# Patient Record
Sex: Male | Born: 1935 | Race: White | Hispanic: No | Marital: Married | Smoking: Never smoker
Health system: Southern US, Community
[De-identification: ages and names within clinical notes are randomized; demographics above are authoritative.]

## PROBLEM LIST (undated history)

## (undated) DIAGNOSIS — G309 Alzheimer's disease, unspecified: Secondary | ICD-10-CM

## (undated) DIAGNOSIS — I10 Essential (primary) hypertension: Secondary | ICD-10-CM

## (undated) DIAGNOSIS — F0280 Dementia in other diseases classified elsewhere without behavioral disturbance: Secondary | ICD-10-CM

---

## 2001-07-07 ENCOUNTER — Ambulatory Visit (HOSPITAL_COMMUNITY): Admission: RE | Admit: 2001-07-07 | Discharge: 2001-07-07 | Payer: Self-pay | Admitting: Cardiology

## 2011-11-14 ENCOUNTER — Emergency Department (HOSPITAL_COMMUNITY): Payer: Medicare Other

## 2011-11-14 ENCOUNTER — Other Ambulatory Visit: Payer: Self-pay

## 2011-11-14 ENCOUNTER — Encounter (HOSPITAL_COMMUNITY): Payer: Self-pay | Admitting: *Deleted

## 2011-11-14 ENCOUNTER — Emergency Department (HOSPITAL_COMMUNITY)
Admission: EM | Admit: 2011-11-14 | Discharge: 2011-11-14 | Disposition: A | Discharge status: Home / Self Care | Payer: Medicare Other | Attending: Emergency Medicine | Admitting: Emergency Medicine

## 2011-11-14 DIAGNOSIS — R9431 Abnormal electrocardiogram [ECG] [EKG]: Secondary | ICD-10-CM | POA: Insufficient documentation

## 2011-11-14 DIAGNOSIS — R011 Cardiac murmur, unspecified: Secondary | ICD-10-CM | POA: Insufficient documentation

## 2011-11-14 DIAGNOSIS — R42 Dizziness and giddiness: Secondary | ICD-10-CM | POA: Insufficient documentation

## 2011-11-14 HISTORY — DX: Essential (primary) hypertension: I10

## 2011-11-14 LAB — URINALYSIS, ROUTINE W REFLEX MICROSCOPIC
Ketones, ur: NEGATIVE mg/dL
Leukocytes, UA: NEGATIVE
Nitrite: NEGATIVE
Specific Gravity, Urine: 1.021 (ref 1.005–1.030)
Urobilinogen, UA: 0.2 mg/dL (ref 0.0–1.0)
pH: 5.5 (ref 5.0–8.0)

## 2011-11-14 LAB — CBC
HCT: 43.5 % (ref 39.0–52.0)
Hemoglobin: 15.3 g/dL (ref 13.0–17.0)
MCH: 31.4 pg (ref 26.0–34.0)
MCHC: 35.2 g/dL (ref 30.0–36.0)

## 2011-11-14 LAB — COMPREHENSIVE METABOLIC PANEL
Albumin: 3.5 g/dL (ref 3.5–5.2)
BUN: 22 mg/dL (ref 6–23)
Creatinine, Ser: 1.2 mg/dL (ref 0.50–1.35)
Total Bilirubin: 1.2 mg/dL (ref 0.3–1.2)
Total Protein: 6.4 g/dL (ref 6.0–8.3)

## 2011-11-14 LAB — DIFFERENTIAL
Basophils Relative: 0 % (ref 0–1)
Eosinophils Absolute: 0.1 10*3/uL (ref 0.0–0.7)
Monocytes Absolute: 0.4 10*3/uL (ref 0.1–1.0)
Monocytes Relative: 6 % (ref 3–12)

## 2011-11-14 LAB — TROPONIN I: Troponin I: <0.3 ng/mL (ref <0.30)

## 2011-11-14 LAB — URINE MICROSCOPIC-ADD ON

## 2011-11-14 MED ORDER — MECLIZINE HCL 12.5 MG PO TABS: 25.0000 mg | ORAL_TABLET | Freq: Three times a day (TID) | ORAL | Status: AC

## 2011-11-14 MED ORDER — DIAZEPAM 5 MG PO TABS: 2.5000 mg | ORAL_TABLET | Freq: Four times a day (QID) | ORAL | Status: AC | PRN

## 2011-11-14 MED ORDER — MECLIZINE HCL 25 MG PO TABS
25.0000 mg | ORAL_TABLET | Freq: Once | ORAL | Status: AC
Start: 2011-11-14 — End: 2011-11-14
  Administered 2011-11-14: 25 mg via ORAL
  Filled 2011-11-14: qty 1

## 2011-11-14 NOTE — ED Notes (Signed)
Pt. Is unable to use the restroom at this time. 

## 2011-11-14 NOTE — ED Provider Notes (Signed)
History     CSN: [id number]  Arrival date & time 11/14/11  0727   First MD Initiated Contact with Patient 11/14/11 [phone]      No chief complaint on file.    HPI The patient presents following the acute onset of dizziness.  He notes that he awoke approximately 3 hours ago in his usual state of health.  Just prior to arrival the patient was standing, developed a sense of unsteadiness, as though he was going to fall down.  The patient notes that he has had no confusion, chest pain, no dyspnea, no visual changes.  Symptoms persisted with standing, are minimally better when sitting, or resting.  The patient was noted to aspirin by his wife, denies notable changes. He notes that he has been compliant with his medication, and a beta blocker and an angiotensin receptor blocker. He denies recent total health events or any recent illness. No past medical history on file.  No past surgical history on file.  No family history on file.  History  Substance Use Topics  . Smoking status: Not on file  . Smokeless tobacco: Not on file  . Alcohol Use: Not on file      Review of Systems  Constitutional:       Per HPI, otherwise negative  HENT:       Per HPI, otherwise negative  Eyes: Negative.   Respiratory:       Per HPI, otherwise negative  Cardiovascular:       Per HPI, otherwise negative  Gastrointestinal: Negative for vomiting.  Genitourinary: Negative.   Musculoskeletal:       Per HPI, otherwise negative  Skin: Negative.   Neurological: Positive for dizziness and light-headedness. Negative for syncope, facial asymmetry, speech difficulty, weakness, numbness and headaches.    Allergies  Review of patient's allergies indicates not on file.  Home Medications  No current outpatient prescriptions on file.  There were no vitals taken for this visit.  Physical Exam  Nursing note and vitals reviewed. Constitutional: He is oriented to person, place, and time. He appears  well-developed. No distress.       Patient notes that he has been taking meds as directed, but one month supply (filled 2 months ago) has several pills remaining.  HENT:  Head: Normocephalic and atraumatic.  Right Ear: Tympanic membrane and ear canal normal. Tympanic membrane is not injected. No hemotympanum.  Left Ear: Ear canal normal.       L TM obscured by significant amounts of cerumen.  Eyes: Conjunctivae and EOM are normal.  Cardiovascular: Normal rate and regular rhythm.   Murmur heard. Pulmonary/Chest: Effort normal. No stridor. No respiratory distress.  Abdominal: He exhibits no distension.  Musculoskeletal: He exhibits no edema.  Neurological: He is alert and oriented to person, place, and time. He has normal strength. He displays no atrophy and no tremor. No cranial nerve deficit or sensory deficit. He exhibits normal muscle tone. He displays a negative Romberg sign. He displays no seizure activity. Coordination and gait normal.       No focal neuro deficits; CN, cerebellar, gait, romberg, pronator  Skin: Skin is warm and dry.  Psychiatric: He has a normal mood and affect.    ED Course  Procedures (including critical care time)   Labs Reviewed  CBC  DIFFERENTIAL  COMPREHENSIVE METABOLIC PANEL  TROPONIN I  URINALYSIS, ROUTINE W REFLEX MICROSCOPIC   No results found.   No diagnosis found.  Cardiac: 60 sr- normal  Pulse ox 100% ra- normal  CXR / Head CT both reviewed by me   Date: 11/14/2011  Rate: 54  Rhythm: normal sinus rhythm  QRS Axis: left  Intervals: PR prolonged  ST/T Wave abnormalities: nonspecific T wave changes  Conduction Disutrbances:left anterior fascicular block  Narrative Interpretation:   Old EKG Reviewed: none available  ABNORMAL ECG  MDM  This 78 year old male presents with vertigo.  On exam patient is in no distress, has no easily provoked symptoms, though he notes mild disequilibrium when upright.  The patient's physical exam notable  only for an occluded left tympanic membrane due to cerumen.  The patient's ECG, head CT and chest x-ray are reassuring.  I discussed the finding of a left ventricular lesion with the patient, his wife, his daughter.  Absent hydrocephalus, surrounding edema, and with calcifications, this lesion is likely to be causing today's new symptoms, but clearly warrants further evaluation (as an outpatient.)  Given the tympanic membrane occlusion, the patient's description of disequilibrium, Maisie Fus presentation is most consistent with vertigo.  I discussed this likely, as well as the necessity to evaluate other possible conditions, such as insufficient posterior circulation with the family.  Specifically discussed the need for carotid evaluation.  The patient was discharged in stable condition to follow up with a primary care physician.  The patient's primary care physician recently retired, he has been obtaining care at an urgent care center.  He was provided with one of our internal medicine physicians.        Gerhard Munch, MD 11/14/11 [phone]

## 2011-11-14 NOTE — Discharge Instructions (Signed)
As discussed, there are many possible causes of your lightheadedness.  It is very important that you follow up with a primary care physician to continue evaluation of these possibilities.  Please make sure to follow up as directed.  You need to be sure to discuss both an MRI for additional characterization of a possible brain lesion, and ultrasound study of your carotid arteries.  If you develop any new, or concerning changes in your condition, please return to emergency department immediately.

## 2011-11-14 NOTE — ED Notes (Signed)
Pt reports feeling lightheaded upon waking this am. Noticed it was worse when standing. Hx of HTN, took meds today. Denies chest pain, shob. No facial droop, arm drift, unilateral weakness noted.

## 2016-12-07 ENCOUNTER — Emergency Department (HOSPITAL_COMMUNITY): Payer: Medicare Other

## 2016-12-07 ENCOUNTER — Emergency Department (HOSPITAL_COMMUNITY)
Admission: EM | Admit: 2016-12-07 | Discharge: 2016-12-12 | Disposition: A | Discharge status: Home / Self Care | Payer: Medicare Other | Attending: Emergency Medicine | Admitting: Emergency Medicine

## 2016-12-07 DIAGNOSIS — F0280 Dementia in other diseases classified elsewhere without behavioral disturbance: Secondary | ICD-10-CM | POA: Diagnosis present

## 2016-12-07 DIAGNOSIS — F0281 Dementia in other diseases classified elsewhere with behavioral disturbance: Secondary | ICD-10-CM | POA: Insufficient documentation

## 2016-12-07 DIAGNOSIS — G309 Alzheimer's disease, unspecified: Secondary | ICD-10-CM

## 2016-12-07 DIAGNOSIS — F02818 Dementia in other diseases classified elsewhere, unspecified severity, with other behavioral disturbance: Secondary | ICD-10-CM

## 2016-12-07 DIAGNOSIS — G300 Alzheimer's disease with early onset: Secondary | ICD-10-CM | POA: Diagnosis not present

## 2016-12-07 DIAGNOSIS — Z79899 Other long term (current) drug therapy: Secondary | ICD-10-CM | POA: Diagnosis not present

## 2016-12-07 DIAGNOSIS — F0391 Unspecified dementia with behavioral disturbance: Secondary | ICD-10-CM | POA: Diagnosis not present

## 2016-12-07 DIAGNOSIS — G308 Other Alzheimer's disease: Secondary | ICD-10-CM

## 2016-12-07 DIAGNOSIS — Z9181 History of falling: Secondary | ICD-10-CM

## 2016-12-07 DIAGNOSIS — I10 Essential (primary) hypertension: Secondary | ICD-10-CM | POA: Insufficient documentation

## 2016-12-07 LAB — CBC WITH DIFFERENTIAL/PLATELET
BASOS ABS: 0 10*3/uL (ref 0.0–0.1)
Basophils Relative: 0 %
EOS ABS: 0.3 10*3/uL (ref 0.0–0.7)
EOS PCT: 3 %
HCT: 42.9 % (ref 39.0–52.0)
HEMOGLOBIN: 14.8 g/dL (ref 13.0–17.0)
LYMPHS ABS: 1.8 10*3/uL (ref 0.7–4.0)
Lymphocytes Relative: 18 %
MCH: 31.2 pg (ref 26.0–34.0)
MCHC: 34.5 g/dL (ref 30.0–36.0)
MCV: 90.3 fL (ref 78.0–100.0)
Monocytes Absolute: 0.7 10*3/uL (ref 0.1–1.0)
Monocytes Relative: 8 %
NEUTROS PCT: 71 %
Neutro Abs: 7 10*3/uL (ref 1.7–7.7)
PLATELETS: 181 10*3/uL (ref 150–400)
RBC: 4.75 MIL/uL (ref 4.22–5.81)
RDW: 13.1 % (ref 11.5–15.5)
WBC: 9.9 10*3/uL (ref 4.0–10.5)

## 2016-12-07 LAB — COMPREHENSIVE METABOLIC PANEL
ALK PHOS: 74 U/L (ref 38–126)
ALT: 25 U/L (ref 17–63)
AST: 25 U/L (ref 15–41)
Albumin: 3.7 g/dL (ref 3.5–5.0)
Anion gap: 11 (ref 5–15)
BUN: 11 mg/dL (ref 6–20)
CALCIUM: 8.9 mg/dL (ref 8.9–10.3)
CHLORIDE: 102 mmol/L (ref 101–111)
CO2: 25 mmol/L (ref 22–32)
CREATININE: 1.14 mg/dL (ref 0.61–1.24)
GFR calc Af Amer: >60 mL/min (ref >60)
GFR calc non Af Amer: 57 mL/min — ABNORMAL LOW (ref >60)
Glucose, Bld: 164 mg/dL — ABNORMAL HIGH (ref 65–99)
Potassium: 3.8 mmol/L (ref 3.5–5.1)
SODIUM: 138 mmol/L (ref 135–145)
Total Bilirubin: 2 mg/dL — ABNORMAL HIGH (ref 0.3–1.2)
Total Protein: 6.2 g/dL — ABNORMAL LOW (ref 6.5–8.1)

## 2016-12-07 LAB — URINALYSIS, ROUTINE W REFLEX MICROSCOPIC
BACTERIA UA: NONE SEEN
BILIRUBIN URINE: NEGATIVE
Glucose, UA: >=500 mg/dL — AB
Ketones, ur: NEGATIVE mg/dL
Leukocytes, UA: NEGATIVE
NITRITE: NEGATIVE
PH: 5 (ref 5.0–8.0)
Protein, ur: NEGATIVE mg/dL
SPECIFIC GRAVITY, URINE: 1.012 (ref 1.005–1.030)
SQUAMOUS EPITHELIAL / LPF: NONE SEEN

## 2016-12-07 MED ORDER — IRBESARTAN 300 MG PO TABS
300.0000 mg | ORAL_TABLET | Freq: Every day | ORAL | Status: DC
  Administered 2016-12-08 – 2016-12-11 (×3): 300 mg via ORAL
  Filled 2016-12-07 (×7): qty 1

## 2016-12-07 MED ORDER — LORAZEPAM 2 MG/ML IJ SOLN
2.0000 mg | Freq: Once | INTRAMUSCULAR | Status: AC
  Administered 2016-12-07: 2 mg via INTRAVENOUS

## 2016-12-07 MED ORDER — LORAZEPAM 2 MG/ML IJ SOLN
INTRAMUSCULAR | Status: AC
  Administered 2016-12-07: 2 mg via INTRAVENOUS
  Filled 2016-12-07: qty 1

## 2016-12-07 MED ORDER — METOPROLOL SUCCINATE ER 50 MG PO TB24
50.0000 mg | ORAL_TABLET | Freq: Every day | ORAL | Status: DC
  Administered 2016-12-08 – 2016-12-12 (×4): 50 mg via ORAL
  Filled 2016-12-07 (×8): qty 1

## 2016-12-07 MED ORDER — LORAZEPAM 1 MG PO TABS
1.0000 mg | ORAL_TABLET | Freq: Three times a day (TID) | ORAL | Status: DC
  Administered 2016-12-08 (×2): 1 mg via ORAL
  Filled 2016-12-07 (×3): qty 1

## 2016-12-07 NOTE — NC FL2 (Signed)
  [place] MEDICAID FL2 LEVEL OF CARE SCREENING TOOL     IDENTIFICATION  Patient Name: Corey Hancock Birthdate: 1930/02/11 Sex: male Admission Date (Current Location): 12/07/2016  [facility] and [place] Number:  Producer, television/film/video and Address:  [facility], [address] [place], [place] [address]      Provider Number: [id number]  Attending Physician Name and Address:  Lavera Guise, MD  Relative Name and Phone Number:       Current Level of Care: Hospital Recommended Level of Care: Skilled Nursing Facility Prior Approval Number:    Date Approved/Denied:   PASRR Number:    Discharge Plan: SNF    Current Diagnoses: There are no active problems to display for this patient.   Orientation RESPIRATION BLADDER Height & Weight        Normal Continent Weight: 170 lb (77.1 kg) Height:   (185.4 cm)  BEHAVIORAL SYMPTOMS/MOOD NEUROLOGICAL BOWEL NUTRITION STATUS  Dangerous to self, others or property   Incontinent Diet  AMBULATORY STATUS COMMUNICATION OF NEEDS Skin   Limited Assist Verbally Normal                       Personal Care Assistance Level of Assistance  Bathing, Dressing Bathing Assistance: Limited assistance   Dressing Assistance: Limited assistance     Functional Limitations Info             SPECIAL CARE FACTORS FREQUENCY  PT (By licensed PT), OT (By licensed OT)     PT Frequency: 5 OT Frequency: 5            Contractures Contractures Info: Not present    Additional Factors Info  Code Status, Allergies Code Status Info: Not on file Allergies Info: No known allergies           Current Medications (12/07/2016):  This is the current hospital active medication list No current facility-administered medications for this encounter.    Current Outpatient Prescriptions  Medication Sig Dispense Refill  . irbesartan (AVAPRO) 300 MG tablet Take 300 mg by mouth at bedtime.    Marland Kitchen LORazepam (ATIVAN) 1 MG tablet  Take 1 mg by mouth every 8 (eight) hours. For anxity    . metoprolol succinate (TOPROL-XL) 50 MG 24 hr tablet Take 50 mg by mouth daily. Take with or immediately following a meal.       Discharge Medications: Please see discharge summary for a list of discharge medications.  Relevant Imaging Results:  Relevant Lab Results:   Additional Information [id number]  Dorothe Pea Markesia Crilly, LCSWA

## 2016-12-07 NOTE — ED Notes (Signed)
Patient taken to xray/CT 

## 2016-12-07 NOTE — Clinical Social Work Note (Signed)
Clinical Social Work Assessment  Patient Details  Name: Corey Hancock MRN: [id number] Date of Birth: 04-07-1930  Date of referral:  12/07/16               Reason for consult:  Facility Placement                Permission sought to share information with:  Oceanographer granted to share information::  Yes, Verbal Permission Granted  Name::        Agency::     Relationship::     Contact Information:     Housing/Transportation Living arrangements for the past 2 months:  Skilled Holiday representative, Assisted Living Facility Source of Information:  Spouse, Adult Children Patient Interpreter Needed:  None Criminal Activity/Legal Involvement Pertinent to Current Situation/Hospitalization:    Significant Relationships:  Adult Children, Spouse Lives with:  Spouse Do you feel safe going back to the place where you live?  No Need for family participation in patient care:  Yes (Comment)  Care giving concerns:  Family cannot control pt who is in last stages of Dementia, per family   Social Worker assessment / plan:  CSW spoke with pt's wife Corey Hancock by phone at [phone] and pt's daughter Corey Hancock at ph: [phone] and confirmed pt's plan to be discharged to SNF at discharge.  CSW provided active listening and validated pt's concerns thsat pt needs memory care but pt's family "can't afford it".   CSW was given permission for CSW DEPT to complete FL-2 and send referrals out to SNF facilities via the hub per pt's request.  Pt's family prefers [facility] are SNF's closer to [employer].  Pt has been living independently with his wife prior to being admitted to [facility].    Employment status:  Retired Database administrator PT Recommendations:  Not assessed at this time Information / Referral to community resources:     Patient/Family's Response to care:  Patient not alert and oriented.  Patient's wife and daughter agreeable to plan.   Pt's  su wife and daughter supportive and strongly involved in pt.'s care.  Pt.'s wife and daughter pleasant and appreciated CSW intervention.    Patient/Family's Understanding of and Emotional Response to Diagnosis, Current Treatment, and Prognosis:  Still assessing  Emotional Assessment Appearance:  Appears stated age Attitude/Demeanor/Rapport:  Unable to Assess Affect (typically observed):  Unable to Assess Orientation:   (dementia) Alcohol / Substance use:    Psych involvement (Current and /or in the community):     Discharge Needs  Concerns to be addressed:  No discharge needs identified Readmission within the last 30 days:  No Current discharge risk:  None Barriers to Discharge:  No Barriers Identified   Mercy Riding, LCSWA 12/07/2016, 10:40 PM

## 2016-12-07 NOTE — ED Notes (Signed)
Pt is demented and keeps taking off pulse ox and blood pressure cuff.

## 2016-12-07 NOTE — Progress Notes (Addendum)
CSW completed FL-2 (without PASSR), and provider signed FL-2.  PASSR screening has been submitted and your MUST ID for reference is [id number].  CSW will be away from work on 3/31, but screen is still running.  [id number] CSW can contact this CSW at [phone] to retrieve PASSR on 12/08/16.  CSW will send out referrals to [facility] area SNF's.  Please reconsult if future social work needs arise.    Dorothe Pea. Kaliq Lege, [place] ED Clinical Social Worker covering for [employer] and [place] Ph: [phone]

## 2016-12-07 NOTE — ED Provider Notes (Addendum)
[facility]-EMERGENCY DEPT Provider Note   CSN: [id number] Arrival date & time: 12/07/16  1629     History   Chief Complaint Chief Complaint  Patient presents with  . Fall  . Urinary Frequency    HPI Corey Hancock is a 83 y.o. male.  HPI Level V caveat due to dementia.  83 year old male with history of dementia, DM and hypertension. History is obtained from EMS who states that they were called today due to frequent falls and urinary frequency at home. Patient is not anticoagulated. Per EMS his last fall was 8 hours ago. Patient's daughter states that he has been having difficulty standing and ambulating. Was complaining of right shoulder pain. Reportedly to be at his baseline mental status by family.  Past Medical History:  Diagnosis Date  . Hypertension     There are no active problems to display for this patient.   No past surgical history on file.     Home Medications    Prior to Admission medications   Medication Sig Start Date End Date Taking? Authorizing Provider  irbesartan (AVAPRO) 300 MG tablet Take 300 mg by mouth at bedtime.   Yes Historical Provider, MD  LORazepam (ATIVAN) 1 MG tablet Take 1 mg by mouth every 8 (eight) hours. For anxity   Yes Historical Provider, MD  metoprolol succinate (TOPROL-XL) 50 MG 24 hr tablet Take 50 mg by mouth daily. Take with or immediately following a meal.   Yes Historical Provider, MD    Family History No family history on file.  Social History Social History  Substance Use Topics  . Smoking status: Never Smoker  . Smokeless tobacco: Not on file  . Alcohol use No     Allergies   Patient has no known allergies.   Review of Systems Review of Systems Unable to obtain due to dementia  Physical Exam Updated Vital Signs BP 111/63   Pulse (!) 59   Temp 98.4 F (36.9 C) (Oral)   Resp 18   Ht  (1.854 m)   Wt 170 lb (77.1 kg)   SpO2 95%   BMI 22.43 kg/m   Physical Exam Physical Exam  Nursing note and  vitals reviewed. Constitutional: elderly man, non-toxic, and in no acute distress Head: Normocephalic and atraumatic.  Mouth/Throat: Oropharynx is clear and moist.  Neck: Normal range of motion. Neck supple. no cervical spine tenderness Eyes: PERRL, EOMI Cardiovascular: Normal rate and regular rhythm.   Pulmonary/Chest: Effort normal and breath sounds normal.  Abdominal: Soft. There is no tenderness. There is no rebound and no guarding.  Musculoskeletal: Normal range of motion.  Neurological: Alert, oriented only to person, no facial droop, fluent speech, moves all extremities symmetrically against gravity, sensation to light touch grossly in tact throughout Skin: Skin is warm and dry.  Psychiatric: Cooperative   ED Treatments / Results  Labs (all labs ordered are listed, but only abnormal results are displayed) Labs Reviewed  COMPREHENSIVE METABOLIC PANEL - Abnormal; Notable for the following:       Result Value   Glucose, Bld 164 (*)    Total Protein 6.2 (*)    Total Bilirubin 2.0 (*)    GFR calc non Af Amer 57 (*)    All other components within normal limits  URINALYSIS, ROUTINE W REFLEX MICROSCOPIC - Abnormal; Notable for the following:    Glucose, UA >=500 (*)    Hgb urine dipstick SMALL (*)    All other components within normal limits  URINE CULTURE  CBC WITH DIFFERENTIAL/PLATELET    EKG  EKG Interpretation None       Radiology Dg Chest 2 View  Result Date: 12/07/2016 CLINICAL DATA:  frequent falls and urinary frequency at home, patient reports generalized right shoulder pain. EXAM: CHEST  2 VIEW COMPARISON:  11/14/2011 FINDINGS: The cardiac silhouette is normal in size. No mediastinal or hilar masses. No evidence of adenopathy. Clear lungs.  No pleural effusion.  No pneumothorax. The skeletal structures are demineralized but intact. IMPRESSION: No active cardiopulmonary disease. Electronically Signed   By: Amie Portland M.D.   On: 12/07/2016 18:58   Dg Shoulder  Right  Result Date: 12/07/2016 CLINICAL DATA:  frequent falls and urinary frequency at home, patient reports generalized right shoulder pain. EXAM: RIGHT SHOULDER - 2+ VIEW COMPARISON:  None. FINDINGS: No fracture. No bone lesion. The glenohumeral and AC joints are normally spaced and aligned with no significant arthropathic/ degenerative change. Bones are demineralized. Soft tissues are unremarkable. IMPRESSION: No fracture or joint abnormality. Electronically Signed   By: Amie Portland M.D.   On: 12/07/2016 18:57   Ct Head Wo Contrast  Result Date: 12/07/2016 CLINICAL DATA:  Pt unable to provide hx. Per ED notes: dementia w/ frequent falls, gait instability, possible head injuryHTN EXAM: CT HEAD WITHOUT CONTRAST CT CERVICAL SPINE WITHOUT CONTRAST TECHNIQUE: Multidetector CT imaging of the head and cervical spine was performed following the standard protocol without intravenous contrast. Multiplanar CT image reconstructions of the cervical spine were also generated. COMPARISON:  11/14/2011 FINDINGS: CT HEAD FINDINGS Brain: The ventricles are normal in configuration. There is ventricular and sulcal enlargement reflecting moderate atrophy. There are no parenchymal masses or mass effect. There is no evidence of a recent infarct. Minor periventricular white matter hypoattenuation is noted consistent with chronic microvascular ischemic change. There is a small partly calcified lesion in the left lateral ventricle measuring 1 cm, stable from the prior exam. These There are no extra-axial masses or abnormal fluid collections. There is no intracranial hemorrhage. Vascular: Prominent calcification noted along the distal vertebral arteries. Skull: Normal. Negative for fracture or focal lesion. Sinuses/Orbits: Globes and orbits are unremarkable. Visualized sinuses and mastoid air cells are clear. Other: None. CT CERVICAL SPINE FINDINGS Alignment: Slight kyphosis centered at the C5 level. No spondylolisthesis. Skull base  and vertebrae: No acute fracture. No primary bone lesion or focal pathologic process. Soft tissues and spinal canal: No prevertebral fluid or swelling. No visible canal hematoma. Disc levels: There are disc degenerative changes throughout the cervical spine with moderate loss disc height at C3-C4 and C4-C5 and marked loss of disc height with endplate sclerosis and osteophytes at C5-C6 and C6-C7. Acquired fusion is noted of the facet joint at C3-C4 on the right and C3-C4 and C4-C5 on the left. The bones are demineralized. No convincing disc herniation. Upper chest: Clear upper lungs.  13 mm right thyroid lobe nodule. Other: None IMPRESSION: HEAD CT:  No acute intracranial abnormalities.  No skull fracture. CERVICAL CT:  No fracture or acute finding. Electronically Signed   By: Amie Portland M.D.   On: 12/07/2016 18:37   Ct Cervical Spine Wo Contrast  Result Date: 12/07/2016 CLINICAL DATA:  Pt unable to provide hx. Per ED notes: dementia w/ frequent falls, gait instability, possible head injuryHTN EXAM: CT HEAD WITHOUT CONTRAST CT CERVICAL SPINE WITHOUT CONTRAST TECHNIQUE: Multidetector CT imaging of the head and cervical spine was performed following the standard protocol without intravenous contrast. Multiplanar CT image reconstructions of the cervical spine were  also generated. COMPARISON:  11/14/2011 FINDINGS: CT HEAD FINDINGS Brain: The ventricles are normal in configuration. There is ventricular and sulcal enlargement reflecting moderate atrophy. There are no parenchymal masses or mass effect. There is no evidence of a recent infarct. Minor periventricular white matter hypoattenuation is noted consistent with chronic microvascular ischemic change. There is a small partly calcified lesion in the left lateral ventricle measuring 1 cm, stable from the prior exam. These There are no extra-axial masses or abnormal fluid collections. There is no intracranial hemorrhage. Vascular: Prominent calcification noted  along the distal vertebral arteries. Skull: Normal. Negative for fracture or focal lesion. Sinuses/Orbits: Globes and orbits are unremarkable. Visualized sinuses and mastoid air cells are clear. Other: None. CT CERVICAL SPINE FINDINGS Alignment: Slight kyphosis centered at the C5 level. No spondylolisthesis. Skull base and vertebrae: No acute fracture. No primary bone lesion or focal pathologic process. Soft tissues and spinal canal: No prevertebral fluid or swelling. No visible canal hematoma. Disc levels: There are disc degenerative changes throughout the cervical spine with moderate loss disc height at C3-C4 and C4-C5 and marked loss of disc height with endplate sclerosis and osteophytes at C5-C6 and C6-C7. Acquired fusion is noted of the facet joint at C3-C4 on the right and C3-C4 and C4-C5 on the left. The bones are demineralized. No convincing disc herniation. Upper chest: Clear upper lungs.  13 mm right thyroid lobe nodule. Other: None IMPRESSION: HEAD CT:  No acute intracranial abnormalities.  No skull fracture. CERVICAL CT:  No fracture or acute finding. Electronically Signed   By: Amie Portland M.D.   On: 12/07/2016 18:37    Procedures Procedures (including critical care time)  Medications Ordered in ED Medications  LORazepam (ATIVAN) injection 2 mg (2 mg Intravenous Given 12/07/16 2010)     Initial Impression / Assessment and Plan / ED Course  I have reviewed the triage vital signs and the nursing notes.  Pertinent labs & imaging results that were available during my care of the patient were reviewed by me and considered in my medical decision making (see chart for details).     Additional history obtained from daughter who is patient's caregiver at home. Has had drastic decline over past 1-2 weeks after aspiration/choking episode. He has become more combative at home. Having gait instability and frequent falls. Having increased confusion, for example began brushing his teeth with the  tile holder for the game of Scrabble. No fever or chills, n/v, but having urinary frequency and loss of control of his urine at home.   Appears to have worsening alzheimer's dementia with difficulty with care by family at home. Vitals stable. No focal neuro deficits. CT head and neck visualized w/o traumatic injury or other acute processes. No major electrolyte or metabolic derangements. No signs of UTI. CXR visualized and without pneumonia or other acute cardiopulmonary processes.   Suspect worsening dementia. Discussed with Christiane Ha, from social work who will attempt placement through the ED. Thinks potentially she may be placed over next day. PT consult placed for AM.     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

## 2016-12-07 NOTE — ED Triage Notes (Signed)
Patient comes in per [facility] with c/o increased urination and recent falls. Fm home. POA is daughter. Not on blood thinners. Last fall 8 hours ago and denies hitting head. POA states cannot stand but pt able to for EMS. POA states the patient has right shoulder pain; however, patient denies right shoulder pain. Old hematoma on R flank noted by EMS. Alert to baseline. Hx of alzheimers. POA states PCP wants patient admitted. Towel roll d/t able to assess for neck pain. Hx of DM and HTN. EMS v/s 144/100, HR 70, RR 20, 97% RA. cbg 188.

## 2016-12-08 LAB — URINE CULTURE

## 2016-12-08 MED ORDER — ALUM & MAG HYDROXIDE-SIMETH 200-200-20 MG/5ML PO SUSP: 30.0000 mL | ORAL | Status: DC | PRN

## 2016-12-08 MED ORDER — ACETAMINOPHEN 500 MG PO TABS
1000.0000 mg | ORAL_TABLET | Freq: Once | ORAL | Status: AC
  Administered 2016-12-08: 1000 mg via ORAL
  Filled 2016-12-08: qty 2

## 2016-12-08 MED ORDER — HALOPERIDOL LACTATE 5 MG/ML IJ SOLN
3.0000 mg | Freq: Once | INTRAMUSCULAR | Status: AC
  Administered 2016-12-08: 3 mg via INTRAMUSCULAR
  Filled 2016-12-08: qty 1

## 2016-12-08 MED ORDER — LORAZEPAM 2 MG/ML IJ SOLN
2.0000 mg | Freq: Once | INTRAMUSCULAR | Status: AC
  Administered 2016-12-08: 2 mg via INTRAMUSCULAR
  Filled 2016-12-08: qty 1

## 2016-12-08 MED ORDER — ACETAMINOPHEN 325 MG PO TABS: 650.0000 mg | ORAL_TABLET | ORAL | Status: DC | PRN

## 2016-12-08 NOTE — ED Provider Notes (Signed)
Patient here for SNF placement. VSS, labs unremarkable. However, on my review of notes/records, pt has required multiple ativan and haldol doses for agitation/behavioral issues. Feel that TTS consult indicated for Geri-psych placement/recommendations re: med management for agitation.   Shaune Pollack, MD 12/08/16 [phone]

## 2016-12-08 NOTE — BH Assessment (Signed)
Discussed case with Nira Conn, NP who said based on Pt's medical record and current presentation he does not believe Pt would benefit from geriatric-psychiatry admission and said Pt appears to need placement on a memory care unit. Discussed recommendation with Dr. Shaune Pollack who requests Pt be evaluated by psychiatry in the morning for medication recommendations regarding Pt's agitation/behavioral issues. TTS will notify psychiatry of AM consult. Notified Matt, RN of plan.   [name] Patsy Baltimore, LPC, [facility], [facility] Triage Specialist [phone]

## 2016-12-08 NOTE — BH Assessment (Addendum)
Attempted TTS consult via tele-cart. Pt is unable to recognize there is someone speaking to him on the screen. He appears to infrequently respond to the nursing staff in the room. TTS with consult with psychiatry on how to proceed.   [name] Patsy [place], LPC, [facility], [facility] Triage Specialist [phone]

## 2016-12-08 NOTE — ED Notes (Signed)
Pt dinner came. Pt ate a 90% of a cheese burger his daughter brought to him earlier. He also ate about 1/3 of his lasagna that came with his dinner tray. He had 2 bites of bread and some water.

## 2016-12-08 NOTE — ED Notes (Signed)
Pt unable to answer questions for TTS.

## 2016-12-08 NOTE — Progress Notes (Signed)
Clinical Social Worker met patients daughter Vicente Males) at bedside to discuss patients discharge needs. Vicente Males stated that she has been providing for the patients care. Vicente Males stated she wants patient to be placed in a locked memory care since patient dementia has been progressing rapidly. Patient does have a sitter due to him wanting to walk out of his room. MD is aware that before patient can be placed he would need to be without a sitter for no less than 24/hrs. CSW gave Vicente Males a list of facility and spoke in length about patients discharge. CSW explained to Vicente Males that if patient is unable to discharge to SNF, the family would have to think of alternative placement.  Rhea Pink, MSW,  [place]

## 2016-12-08 NOTE — ED Notes (Signed)
Dinner tray ordered; reg diet

## 2016-12-08 NOTE — ED Notes (Signed)
Pt attempting to climb out of bed. Pt assisted to standing with 2 staff members and sat in chair for a few minutes with staff at bedside. Pt placed in hospital bed for comfort.

## 2016-12-08 NOTE — ED Notes (Signed)
Pt ate half [place] sand which and ice cream. Pt drinking water regularly

## 2016-12-08 NOTE — ED Provider Notes (Signed)
Blood pressure (!) 167/81, pulse (!) 59, temperature 97.9 F (36.6 C), resp. rate 18, height  (1.854 m), weight 170 lb (77.1 kg), SpO2 96 %.  Assuming care from Dr. Blinda Leatherwood.  In short, Corey Hancock is a 83 y.o. male with a chief complaint of Fall and Urinary Frequency .  Refer to the original H&P for additional details.  The current plan of care is to follow with Case Manager and PT this AM for placement.  08:00 AM Patient is confused. He is trying to get out of bed. The staff is concerned that he will. He is not able to be verbally redirected. In review of his medication history he has had multiple doses of Ativan. Plan for 3 mg of IM Haldol to treat the patient's agitation and keep him safe from falling. Physical therapy and case manager consult pending. Patient resting in plain view of staff. Will reassess.   03:15 PM Spoke with the patient's daughter who is at bedside along with case manager he was there as well. We are working to establish placement but we are having some difficulty. Agree that we will continue searching but also expressed that finding placement from the emergency department directly is likely not possible and started discussing help at home. The patient's daughter cannot afford to have 24/7 care at home even for a short period while they search for placement as an outpatient.   Alona Bene, MD   Maia Plan, MD 12/09/16 [phone]

## 2016-12-08 NOTE — ED Notes (Signed)
RN attempted to call SW - [facility] SW phone number is forwarded to [place], SW at [facility], advised she is not covering [facility] and will call back w/phone number. No call back received as of yet. Jeannie, CM, attempting to assist w/reaching [facility] SW - Morrie Sheldon.

## 2016-12-08 NOTE — ED Notes (Signed)
Pt daughter came in a woke pt up and gave him a ice cream on a stick with a chocolate coating

## 2016-12-08 NOTE — ED Notes (Addendum)
Corey Hancock, SW, advised pt will need a 3-day medical admission hospital stay for SNF placement. Advised she will contact Dr Jacqulyn Bath to discuss.

## 2016-12-08 NOTE — ED Notes (Signed)
Gave patient a urinal patient is resting 

## 2016-12-08 NOTE — ED Notes (Signed)
Pt thrashing in bed, kicking at the nurse and the tech.  Pt requires two people to keep him in the bed.  One moment the pt is wanting to lay in bed and the next the pt is trying to climb out of bed.  Unable to verbally reason with pt due to dementia.  Pt currently has a sitter it is this Rn's judgement that the pt is not safe with just one person in the room as he can over power them.

## 2016-12-08 NOTE — ED Notes (Signed)
Charge RN aware of need for Recruitment consultant, NT at beside for 1:1 Recruitment consultant, Physical Therapy at bedside

## 2016-12-08 NOTE — ED Notes (Signed)
Patient increasingly agitated. Trying to remove monitor attachments and leave bed. Attempt to distract patient with other tasks unsuccessful. MD made aware. Safety sitter requested

## 2016-12-08 NOTE — ED Notes (Signed)
Pt is demented and will not keep his cuff and Pulse ox .

## 2016-12-08 NOTE — Evaluation (Signed)
Physical Therapy Evaluation Patient Details Name: Corey Hancock MRN: [id number] DOB: 08-08-1930 Today's Date: 12/08/2016   History of Present Illness  83 year old male with history of dementia, DM and hypertension. History is obtained from EMS who states that they were called today due to frequent falls and urinary frequency at home. PMH: DM,HTN  Clinical Impression  Pt admitted with above diagnosis. Pt currently with functional limitations due to the deficits listed below (see PT Problem List). Pt was able to ambulate on unit with +2 assist of 2.  Pt generally unsteady and confused.  Will need total care and is appropriate for SNF.  Will follow acutely.  Pt will benefit from skilled PT to increase their independence and safety with mobility to allow discharge to the venue listed below.      Follow Up Recommendations SNF;Supervision/Assistance - 24 hour    Equipment Recommendations  Rolling walker with 5" wheels;3in1 (PT)    Recommendations for Other Services       Precautions / Restrictions Precautions Precautions: Fall Restrictions Weight Bearing Restrictions: No      Mobility  Bed Mobility Overal bed mobility: Needs Assistance Bed Mobility: Sit to Supine       Sit to supine: Min guard   General bed mobility comments: able to place LEs in bed without assist.   Transfers Overall transfer level: Needs assistance Equipment used: 2 person hand held assist Transfers: Sit to/from Stand Sit to Stand: Min assist;+2 physical assistance         General transfer comment: Pt needs +2 min assist for steadying and to guide pt to walk.   Ambulation/Gait Ambulation/Gait assistance: Min assist;Mod assist;+2 physical assistance Ambulation Distance (Feet): 135 Feet Assistive device: 2 person hand held assist Gait Pattern/deviations: Decreased step length - left;Decreased step length - right;Decreased stride length;Shuffle;Staggering left;Staggering right;Trunk flexed   Gait  velocity interpretation: Below normal speed for age/gender General Gait Details: Pt takes small steps needing assist for stability.  Pt holding bil hands with therapist and tech supporting pt.  Pt slow moving.  Pt unsteady at times.  Has to be redirected frequently.  Pt constantly asking for daughter and "to walk home".   Stairs            Wheelchair Mobility    Modified Rankin (Stroke Patients Only)       Balance Overall balance assessment: Needs assistance;History of Falls Sitting-balance support: No upper extremity supported;Feet supported Sitting balance-Leahy Scale: Fair Sitting balance - Comments: can sit statically without UE support.   Standing balance support: Bilateral upper extremity supported;During functional activity Standing balance-Leahy Scale: Poor Standing balance comment: relies on UE support.  Unsteady on feet             High level balance activites: Direction changes;Turns;Sudden stops;Backward walking High Level Balance Comments: min assist of 2 for high level activity.              Pertinent Vitals/Pain Pain Assessment: No/denies pain   VSS Home Living Family/patient expects to be discharged to:: Skilled nursing facility                 Additional Comments: No family present to find out prior function.  Pt could not answer questions.     Prior Function Level of Independence: Needs assistance   Gait / Transfers Assistance Needed: per chart states pt needed incr assist to stand and walk lately.   ADL's / Homemaking Assistance Needed: total assist recently  Hand Dominance        Extremity/Trunk Assessment   Upper Extremity Assessment Upper Extremity Assessment: Defer to OT evaluation    Lower Extremity Assessment Lower Extremity Assessment: Generalized weakness    Cervical / Trunk Assessment Cervical / Trunk Assessment: Kyphotic  Communication   Communication:  (slurred speech)  Cognition Arousal/Alertness:  Lethargic;Suspect due to medications (just had haldol per nurse) Behavior During Therapy: Anxious;Restless;Impulsive Overall Cognitive Status: No family/caregiver present to determine baseline cognitive functioning                                 General Comments: per chart, pt demented.  Pt confused.  Oriented to self only.       General Comments      Exercises     Assessment/Plan    PT Assessment Patient needs continued PT services  PT Problem List Decreased strength;Decreased activity tolerance;Decreased balance;Decreased mobility;Decreased cognition;Decreased knowledge of use of DME;Decreased safety awareness;Decreased coordination;Decreased knowledge of precautions;Cardiopulmonary status limiting activity       PT Treatment Interventions DME instruction;Gait training;Functional mobility training;Therapeutic activities;Therapeutic exercise;Balance training;Patient/family education    PT Goals (Current goals can be found in the Care Plan section)  Acute Rehab PT Goals Patient Stated Goal: unable to state PT Goal Formulation: Patient unable to participate in goal setting Time For Goal Achievement: 12/22/16 Potential to Achieve Goals: Good    Frequency Min 2X/week   Barriers to discharge Decreased caregiver support      Co-evaluation               End of Session Equipment Utilized During Treatment: Gait belt Activity Tolerance: Patient limited by fatigue (limited by confusion) Patient left: in bed;with call bell/phone within reach;with bed alarm set;with nursing/sitter in room Nurse Communication: Mobility status PT Visit Diagnosis: Unsteadiness on feet (R26.81);Muscle weakness (generalized) (M62.81);History of falling (Z91.81)    Time: 4403-4742 PT Time Calculation (min) (ACUTE ONLY): 15 min   Charges:   PT Evaluation $PT Eval Moderate Complexity: 1 Procedure     PT G Codes:   PT G-Codes **NOT FOR INPATIENT CLASS** Functional Assessment  Tool Used: AM-PAC 6 Clicks Basic Mobility Functional Limitation: Mobility: Walking and moving around Mobility: Walking and Moving Around Current Status (V9563): At least 40 percent but less than 60 percent impaired, limited or restricted Mobility: Walking and Moving Around Goal Status [phone]): At least 1 percent but less than 20 percent impaired, limited or restricted    [facility] Acute Rehabilitation [phone] (pager)   Berline Lopes 12/08/2016, 10:09 AM

## 2016-12-09 DIAGNOSIS — G308 Other Alzheimer's disease: Secondary | ICD-10-CM

## 2016-12-09 DIAGNOSIS — F0280 Dementia in other diseases classified elsewhere without behavioral disturbance: Secondary | ICD-10-CM | POA: Diagnosis present

## 2016-12-09 DIAGNOSIS — F0281 Dementia in other diseases classified elsewhere with behavioral disturbance: Secondary | ICD-10-CM

## 2016-12-09 DIAGNOSIS — Z79899 Other long term (current) drug therapy: Secondary | ICD-10-CM | POA: Diagnosis not present

## 2016-12-09 DIAGNOSIS — G309 Alzheimer's disease, unspecified: Secondary | ICD-10-CM

## 2016-12-09 MED ORDER — ACETAMINOPHEN 325 MG PO TABS
650.0000 mg | ORAL_TABLET | Freq: Once | ORAL | Status: DC
  Filled 2016-12-09: qty 2

## 2016-12-09 MED ORDER — DIPHENHYDRAMINE HCL 25 MG PO CAPS
50.0000 mg | ORAL_CAPSULE | Freq: Two times a day (BID) | ORAL | Status: DC
  Administered 2016-12-10: 50 mg via ORAL
  Filled 2016-12-09 (×2): qty 2

## 2016-12-09 MED ORDER — LORAZEPAM 2 MG/ML IJ SOLN
2.0000 mg | Freq: Once | INTRAMUSCULAR | Status: AC
  Administered 2016-12-09: 2 mg via INTRAMUSCULAR
  Filled 2016-12-09: qty 1

## 2016-12-09 MED ORDER — HALOPERIDOL 5 MG PO TABS
5.0000 mg | ORAL_TABLET | Freq: Two times a day (BID) | ORAL | Status: DC
  Administered 2016-12-10: 5 mg via ORAL
  Filled 2016-12-09 (×2): qty 1

## 2016-12-09 NOTE — ED Notes (Signed)
Patient refusing ALL meds by mouth.  EDP made aware.

## 2016-12-09 NOTE — ED Notes (Signed)
Family at bedside. 

## 2016-12-09 NOTE — ED Provider Notes (Signed)
Reassessed and met with patient's family, SW, [facility] and nursing staff several times throughout shift.  In short, patient is an 83 yo with advanced dementia who has become increasingly difficult to care for at home. He has had a prolonged ED stay and requires lots of extra ED resources.  Patient initially came in on 3/30, and was seen and a FL2 was filed for placement.   He was seen by multiple ED providers and [facility] was ordered both for ger psych eval (denied) and then med management (5 mg PO haldol + 50 mg of benadryl BID)  Patient has spent the last >50 hours in the ED.  He has required > 10 doses of IM ativan.   I reapproached both SW and [facility] today.  SW said that he was accepted at 3 facilities but that there was no one working today to accept the [place].  Pt daugher was approached about these places and was not enthusiastic about them.  In the meantime I reapproached [facility].  Given the mutlipel doses of IM medication, pt refusing to take PO meds and constant care needed, I asked for fruther eval for geri psych.  I spoke with [facility] and they told me they were sending the information to try to get a bed at [place], [place].  Charge nurse Roselyn Reef) aware.    Pt daughter concerned about shoulder- it was already imaged and he had full ROM with no pain.    Deseree Zemaitis Julio Alm, MD 12/09/16 Curly [place]

## 2016-12-09 NOTE — ED Notes (Signed)
Patient awake and trying to get out of bed constantly.  Fall risk. Floor mat at bedside.  EDP made aware of circumstances.

## 2016-12-09 NOTE — Consult Note (Signed)
Telepsych Consultation   Reason for Consult:  Alzheimer's dementia Referring Physician:  EDP Patient Identification: Corey Hancock MRN:  [id number] Principal Diagnosis: Dementia due to Alzheimer's disease   Diagnosis:   Patient Active Problem List   Diagnosis Date Noted  . Dementia due to Alzheimer's disease [G30.9, F02.80] 12/09/2016  . Alzheimer disease [G30.9]     Total Time spent with patient: 30 minutes  Subjective:   Caius Silbernagel is a 83 y.o. male patient admitted with Alzheimers dementia.  HPI:  Per tele assessment note on chart written by Rico Sheehan, [facility] Counselor: Attempted TTS consult via tele-cart. Pt is unable to recognize there is someone speaking to him on the screen. He appears to infrequently respond to the nursing staff in the room. TTS with consult with psychiatry on how to proceed.  Discussed case with Lindon Romp, NP who said based on Pt's medical record and current presentation he does not believe Pt would benefit from geriatric-psychiatry admission and said Pt appears to need placement on a memory care unit. Discussed recommendation with Dr. Duffy Bruce who requests Pt be evaluated by psychiatry in the morning for medication recommendations regarding Pt's agitation/behavioral issues. TTS will notify psychiatry of AM consult. Notified Matt, RN of plan.  Today during tele psych consult:  Pt was unable to participate in tele psych due to advanced dementia. FL2 paperwork has been sent out on Pt for possible memory care placement. Pt appeared restless and agitated.  Collateral from Pt's daughter who was present during the consult, "I have been taking care of my father for 24 hours a day since February 09, 2016. He has progressed rapidly through all phases of the disease. I have been giving him children's benadryl crushed in vanilla ice cream at night and lorazepam and it had been working well but not so much anymore. I am also caring for a disabled sister and he  has become too much to handle at home."    Discussed case with Dr Julious Oka at [facility]. Who would like to pursue gero-psych placement along with the pursuit of  memory care placement. Medication recommendations were given to administer Haldol 72m BID with Benadryl 544mBID, these medications can be given IM/IV or PO.   [facility]. [facility] the referral was faxed and will be reviewed by them.     Past Psychiatric History: Dementia  Risk to Self: Is patient at risk for suicide?: No Risk to Others:   Prior Inpatient Therapy:   Prior Outpatient Therapy:    Past Medical History:  Past Medical History:  Diagnosis Date  . Hypertension    No past surgical history on file. Family History: No family history on file. Family Psychiatric  History: UNknown Social History:  History  Alcohol Use No     History  Drug Use No    Social History   Social History  . Marital status: Married    Spouse name: N/A  . Number of children: N/A  . Years of education: N/A   Social History Main Topics  . Smoking status: Never Smoker  . Smokeless tobacco: Not on file  . Alcohol use No  . Drug use: No  . Sexual activity: Not on file   Other Topics Concern  . Not on file   Social History Narrative  . No narrative on file   Additional Social History:    Allergies:  No Known Allergies  Labs:  Results for orders placed or performed during the hospital encounter of 12/07/16 (from the past 48 hour(s))  Urinalysis, Routine w reflex microscopic     Status: Abnormal   Collection Time: 12/07/16  6:09 PM  Result Value Ref Range   Color, Urine YELLOW YELLOW   APPearance CLEAR CLEAR   Specific Gravity, Urine 1.012 1.005 - 1.030   pH 5.0 5.0 - 8.0   Glucose, UA >=500 (A) NEGATIVE mg/dL   Hgb urine dipstick SMALL (A) NEGATIVE   Bilirubin Urine NEGATIVE NEGATIVE   Ketones, ur NEGATIVE NEGATIVE mg/dL   Protein, ur  NEGATIVE NEGATIVE mg/dL   Nitrite NEGATIVE NEGATIVE   Leukocytes, UA NEGATIVE NEGATIVE   RBC / HPF 0-5 0 - 5 RBC/hpf   WBC, UA 0-5 0 - 5 WBC/hpf   Bacteria, UA NONE SEEN NONE SEEN   Squamous Epithelial / LPF NONE SEEN NONE SEEN  Urine culture     Status: Abnormal   Collection Time: 12/07/16  6:09 PM  Result Value Ref Range   Specimen Description URINE, RANDOM    Special Requests NONE    Culture MULTIPLE SPECIES PRESENT, SUGGEST RECOLLECTION (A)    Report Status 12/08/2016 FINAL   CBC with Differential     Status: None   Collection Time: 12/07/16  7:19 PM  Result Value Ref Range   WBC 9.9 4.0 - 10.5 K/uL   RBC 4.75 4.22 - 5.81 MIL/uL   Hemoglobin 14.8 13.0 - 17.0 g/dL   HCT 42.9 39.0 - 52.0 %   MCV 90.3 78.0 - 100.0 fL   MCH 31.2 26.0 - 34.0 pg   MCHC 34.5 30.0 - 36.0 g/dL   RDW 13.1 11.5 - 15.5 %   Platelets 181 150 - 400 K/uL   Neutrophils Relative % 71 %   Neutro Abs 7.0 1.7 - 7.7 K/uL   Lymphocytes Relative 18 %   Lymphs Abs 1.8 0.7 - 4.0 K/uL   Monocytes Relative 8 %   Monocytes Absolute 0.7 0.1 - 1.0 K/uL   Eosinophils Relative 3 %   Eosinophils Absolute 0.3 0.0 - 0.7 K/uL   Basophils Relative 0 %   Basophils Absolute 0.0 0.0 - 0.1 K/uL  Comprehensive metabolic panel     Status: Abnormal   Collection Time: 12/07/16  7:19 PM  Result Value Ref Range   Sodium 138 135 - 145 mmol/L   Potassium 3.8 3.5 - 5.1 mmol/L   Chloride 102 101 - 111 mmol/L   CO2 25 22 - 32 mmol/L   Glucose, Bld 164 (H) 65 - 99 mg/dL   BUN 11 6 - 20 mg/dL   Creatinine, Ser 1.14 0.61 - 1.24 mg/dL   Calcium 8.9 8.9 - 10.3 mg/dL   Total Protein 6.2 (L) 6.5 - 8.1 g/dL   Albumin 3.7 3.5 - 5.0 g/dL   AST 25 15 - 41 U/L   ALT 25 17 - 63 U/L   Alkaline Phosphatase 74 38 - 126 U/L   Total Bilirubin 2.0 (H) 0.3 - 1.2 mg/dL   GFR calc non Af Amer 57 (L) >60 mL/min   GFR calc Af Amer >60 >60 mL/min    Comment: (NOTE) The eGFR has been calculated using the CKD EPI equation. This calculation has not  been validated in all clinical situations. eGFR's persistently <60 mL/min signify possible Chronic Kidney Disease.    Anion gap 11 5 - 15    Current Facility-Administered Medications  Medication Dose Route Frequency Provider Last Rate  Last Dose  . acetaminophen (TYLENOL) tablet 650 mg  650 mg Oral Q4H PRN Duffy Bruce, MD      . acetaminophen (TYLENOL) tablet 650 mg  650 mg Oral Once Courteney Lyn Mackuen, MD      . alum & mag hydroxide-simeth (MAALOX/MYLANTA) 200-200-20 MG/5ML suspension 30 mL  30 mL Oral PRN Duffy Bruce, MD      . diphenhydrAMINE (BENADRYL) capsule 50 mg  50 mg Oral BID Courteney Lyn Mackuen, MD      . haloperidol (HALDOL) tablet 5 mg  5 mg Oral BID Courteney Lyn Mackuen, MD      . irbesartan (AVAPRO) tablet 300 mg  300 mg Oral QHS Forde Dandy, MD   300 mg at 12/08/16 0133  . LORazepam (ATIVAN) tablet 1 mg  1 mg Oral Q8H Forde Dandy, MD   Stopped at 12/08/16 1435  . metoprolol succinate (TOPROL-XL) 24 hr tablet 50 mg  50 mg Oral Daily Forde Dandy, MD   50 mg at 12/08/16 1210   Current Outpatient Prescriptions  Medication Sig Dispense Refill  . irbesartan (AVAPRO) 300 MG tablet Take 300 mg by mouth at bedtime.    Marland Kitchen LORazepam (ATIVAN) 1 MG tablet Take 1 mg by mouth every 8 (eight) hours. For anxity    . metoprolol succinate (TOPROL-XL) 50 MG 24 hr tablet Take 50 mg by mouth daily. Take with or immediately following a meal.      Musculoskeletal: Unable to assess: camera  Psychiatric Specialty Exam: Physical Exam  Review of Systems  Psychiatric/Behavioral: Positive for memory loss. Negative for depression, hallucinations, substance abuse and suicidal ideas. The patient is not nervous/anxious and does not have insomnia.   All other systems reviewed and are negative.   Blood pressure 131/74, pulse 76, temperature 97.9 F (36.6 C), temperature source Oral, resp. rate 17, height _0  (1.854 m), weight 77.1 kg (170 lb), SpO2 96 %.Body mass index is 22.43 kg/m.   General Appearance: Disheveled  Eye Contact:  Poor  Speech:  Blocked  Volume:  Unable to assess, Pt has dementia  Mood:  Agitated   Affect:  Agitated and restless  Thought Process:  Disorganized  Orientation:  Other:  Unable to assess, Pt has dementia  Thought Content:  Unable to assess, Pt has dementia  Suicidal Thoughts:  Unable to assess, Pt has dementia  Homicidal Thoughts:  Unable to assess, Pt has dementia  Memory:  Unable to assess, Pt has dementia  Judgement:  Other:  Unable to assess, Pt has dementia  Insight:  Unable to assess, Pt has dementia  Psychomotor Activity:  Increased  Concentration:  Concentration: Poor  Recall:  Poor  Fund of Knowledge:  Unable to assess, Pt has dementia  Language:  Unable to assess, Pt has dementia  Akathisia:  No  Handed:  Right  AIMS (if indicated):     Assets:  Financial Resources/Insurance Housing Social Support  ADL's:  Impaired  Cognition:  Impaired,  Severe  Sleep:   Poor     Treatment Plan Summary: Daily contact with patient to assess and evaluate symptoms and progress in treatment and Medication management  Medication recommendations given to Dr Julious Oka: Increase Haldol from 3 mg to 64m BID IM/IV or PO for mood stabilization Benadryl 50 mg BID IM/IV or PO for agitation   Disposition: Patient does not meet criteria for psychiatric inpatient admission.  Pt's family is seeking locked memory care placement due to Pt's advanced level of Alzheimer's dementia  Ethelene Hal, NP 12/09/2016 11:16 AM

## 2016-12-09 NOTE — ED Provider Notes (Signed)
Pt continues to try to get out of bed.  Additional dose of ativan ordered   Linwood Dibbles, MD 12/09/16 1626

## 2016-12-09 NOTE — ED Notes (Signed)
Pads placed on floor at bedside. Pt continuously attempting to get out of bed. Tech at bedside to sit with pt.

## 2016-12-09 NOTE — Progress Notes (Signed)
CSW received request, via TTS NP Jimmey Ralph, from [facility] EDP to determine if Corey Hancock has gero-psych beds.  CSW had not previously been familiar with pt.  Reviewed chart and contacted [facility] for bed availabilty.  Per [facility] intake, one male bed available.  Referral faxed to [facility] for review.  TTS NP notified [facility] EDP.  CSW notified [facility] ED CSW as a SNF referral had already been made and patient has been accepted at three different SNF's but will probably not be admitted to a SNF due to Easter holiday.  CSW will follow-up with Corey Hancock and [facility] ED treatment team.  Corey Hancock. Corey Hancock, MSW, LCSWA Clinical Social Work Disposition [phone]

## 2016-12-09 NOTE — Progress Notes (Signed)
CSW was advised by RN to speak with patient regarding placement. CSW reviewed chart and contacted facilities that have accepted the patient. Per Rosey Bath at [facility], they will not be able to accept an LOG for placement on today. CSW left voice message with Velna Hatchet at [facility] regarding bed offer. CSW contacted [facility], however per staff member there is no one in admissions on today. CSW to follow up with agency on tomorrow.   CSW attempted to speak with patient, however unable to assess due to agitation. CSW spoke with patient's daughter Tobi Bastos and son. CSW followed up with family to provide bed offers. Patient has been accepted to [facility], [facility], and [facility] in [place]. Daughter stated she was not interested in either or the facilities due to location. CSW informed family that there are currently no other options for the patient at this time. If family is not interested in bed offers provided then family will have to take the patient home. Patient's daughter was not agreeable to that plan and decided that she was interested in the contact information for the facilities available. CSW provided the family with a SNF list updated with bed offers. Daughter reported she will follow up with facilities and contact CSW on tomorrow. Daughter was appreciative of the services provided by CSW. No other concerns to report at this time.   CSW update MD regarding patient's status with placement. No other concerns reported at this time. CSW will continue to follow and provide support to patient and family while in hospital.   Fernande Boyden, [facility] Clinical Social Worker [facility] Ph: [phone]

## 2016-12-10 DIAGNOSIS — F0280 Dementia in other diseases classified elsewhere without behavioral disturbance: Secondary | ICD-10-CM

## 2016-12-10 DIAGNOSIS — G309 Alzheimer's disease, unspecified: Secondary | ICD-10-CM | POA: Diagnosis not present

## 2016-12-10 DIAGNOSIS — Z79899 Other long term (current) drug therapy: Secondary | ICD-10-CM | POA: Diagnosis not present

## 2016-12-10 MED ORDER — HALOPERIDOL 5 MG PO TABS
5.0000 mg | ORAL_TABLET | Freq: Once | ORAL | Status: AC
  Administered 2016-12-10: 5 mg via ORAL
  Filled 2016-12-10: qty 1

## 2016-12-10 MED ORDER — HALOPERIDOL 5 MG PO TABS: 5.0000 mg | ORAL_TABLET | Freq: Once | ORAL | Status: DC

## 2016-12-10 MED ORDER — HYDROXYZINE HCL 25 MG PO TABS: 25.0000 mg | ORAL_TABLET | Freq: Three times a day (TID) | ORAL | Status: DC | PRN

## 2016-12-10 MED ORDER — LORAZEPAM 2 MG/ML IJ SOLN
INTRAMUSCULAR | Status: AC
  Filled 2016-12-10: qty 1

## 2016-12-10 MED ORDER — RISPERIDONE 0.5 MG PO TBDP
0.5000 mg | ORAL_TABLET | Freq: Two times a day (BID) | ORAL | Status: DC
  Administered 2016-12-11 – 2016-12-12 (×3): 0.5 mg via ORAL
  Filled 2016-12-10 (×7): qty 1

## 2016-12-10 MED ORDER — RISPERIDONE 0.5 MG PO TABS
0.5000 mg | ORAL_TABLET | Freq: Two times a day (BID) | ORAL | Status: DC
  Administered 2016-12-10: 0.5 mg via ORAL
  Filled 2016-12-10 (×3): qty 1

## 2016-12-10 MED ORDER — DIPHENHYDRAMINE HCL 25 MG PO CAPS
50.0000 mg | ORAL_CAPSULE | Freq: Once | ORAL | Status: AC
  Administered 2016-12-10: 50 mg via ORAL
  Filled 2016-12-10: qty 2

## 2016-12-10 MED ORDER — HYDROXYZINE HCL 50 MG/ML IM SOLN
50.0000 mg | Freq: Three times a day (TID) | INTRAMUSCULAR | Status: DC | PRN
  Administered 2016-12-10: 50 mg via INTRAMUSCULAR
  Filled 2016-12-10 (×2): qty 1

## 2016-12-10 MED ORDER — HYDROXYZINE HCL 25 MG PO TABS
50.0000 mg | ORAL_TABLET | Freq: Three times a day (TID) | ORAL | Status: DC | PRN
  Administered 2016-12-11: 50 mg via ORAL
  Filled 2016-12-10 (×2): qty 2

## 2016-12-10 MED ORDER — LORAZEPAM 2 MG/ML IJ SOLN
1.0000 mg | Freq: Once | INTRAMUSCULAR | Status: AC
  Administered 2016-12-10: 1 mg via INTRAMUSCULAR

## 2016-12-10 NOTE — ED Notes (Signed)
I keep redirecting pt to stay in bed.

## 2016-12-10 NOTE — ED Notes (Signed)
Pt is getting really aggressive. PT keeps trying to get out of bed and continues to say that he needs to get out of here. Pt is grabbing on me and my clothes. Pt is squeezing my hand. And telling me that im not a nice person because im not helping him out of bed. Pt also sated that im a bad person for him  Letting him lay in the bed to die.

## 2016-12-10 NOTE — ED Notes (Signed)
Pt did not breakfast tray. Pt at a small portion of eggs and all of ice cream that PT daughter brought.

## 2016-12-10 NOTE — ED Notes (Signed)
Pt daughter left. Pt is relaxing watching tv and drinking water

## 2016-12-10 NOTE — ED Notes (Addendum)
Mellody Dance and I pulled up PT

## 2016-12-10 NOTE — ED Notes (Signed)
Pt agitated, yelling, confused. Sitter at bedside

## 2016-12-10 NOTE — ED Notes (Signed)
Pt continues to act aggressive CNA at bedside for safety

## 2016-12-10 NOTE — Consult Note (Signed)
[facility] Consult   Reason for Consult:  Dementia, Behavioral changes Referring Physician:  EDP Patient Identification: Corey Hancock MRN:  [id number] Principal Diagnosis: Dementia due to Alzheimer's disease Diagnosis:   Patient Active Problem List   Diagnosis Date Noted  . Dementia due to Alzheimer's disease [G30.9, F02.80] 12/09/2016  . Alzheimer disease [G30.9]    Total Time spent with patient: 40 minutes   Subjective:   Corey Hancock is a 83 y.o. male patient admitted with reports of an abrupt change in behavior; history of dementia but sudden deterioration approximately 3-7 days ago per family. Pt seen and chart reviewed. Pt is alert and responds to his name being called but no further orientation could be evaluated due to pt not focusing on assessment. See med recommendations below. Had 3 discussions with pt's daughter about care plan and treatment options. She is HCPOA and paperwork on physical chart at [facility].   HPI:  I have reviewed and concur with HPI elements below, modified as follows:  "Addressed and met with patient's family, SW, [facility] and nursing staff several times throughout shift. In short, patient is an 83 yo with advanced dementia who has become increasingly difficult to care for at home. He has had a prolonged ED stay and requires lots of extra ED resources. Patient initially came in on 3/30, and was seen and a FL2 was filed for placement.   He was seen by multiple ED providers and [facility] was ordered both for ger psych eval (denied) and then med management (5 mg PO haldol + 50 mg of benadryl BID). Patient has spent the last >50 hours in the ED.  He has required > 10 doses of IM ativan.    I reapproached both SW and [facility] 2 days ago. SW said that he was accepted at 3 facilities but that there was no one working today to accept the [place].  Pt daugher was approached about these places and was not enthusiastic about them. In the meantime I  reapproached [facility].  Given the mutliple doses of IM medication, pt refusing to take PO meds and constant care needed, inpatient being sought at [facility].  Pt spent the night in the ED and has a Air cabin crew. He has not shown significant improvement thus far and medications could be adjusted for greater benefit. Seen above on 12/10/16.   Past Psychiatric History: dementia  Risk to Self: Is patient at risk for suicide?: No Risk to Others:   Prior Inpatient Therapy:   Prior Outpatient Therapy:    Past Medical History:  Past Medical History:  Diagnosis Date  . Hypertension    No past surgical history on file. Family History: No family history on file. Family Psychiatric  History: denies Social History:  History  Alcohol Use No     History  Drug Use No    Social History   Social History  . Marital status: Married    Spouse name: N/A  . Number of children: N/A  . Years of education: N/A   Social History Main Topics  . Smoking status: Never Smoker  . Smokeless tobacco: Not on file  . Alcohol use No  . Drug use: No  . Sexual activity: Not on file   Other Topics Concern  . Not on file   Social History Narrative  . No narrative on file   Additional Social History:    Allergies:  No Known Allergies  Labs: No results found for this or any  previous visit (from the past 48 hour(s)).  Current Facility-Administered Medications  Medication Dose Route Frequency Provider Last Rate Last Dose  . acetaminophen (TYLENOL) tablet 650 mg  650 mg Oral Q4H PRN Duffy Bruce, MD      . acetaminophen (TYLENOL) tablet 650 mg  650 mg Oral Once Courteney Lyn Mackuen, MD      . alum & mag hydroxide-simeth (MAALOX/MYLANTA) 200-200-20 MG/5ML suspension 30 mL  30 mL Oral PRN Duffy Bruce, MD      . diphenhydrAMINE (BENADRYL) capsule 50 mg  50 mg Oral BID Courteney Lyn Mackuen, MD      . haloperidol (HALDOL) tablet 5 mg  5 mg Oral BID Courteney Lyn Mackuen, MD      . irbesartan  (AVAPRO) tablet 300 mg  300 mg Oral QHS Forde Dandy, MD   300 mg at 12/08/16 0133  . LORazepam (ATIVAN) tablet 1 mg  1 mg Oral Q8H Forde Dandy, MD   Stopped at 12/08/16 1435  . metoprolol succinate (TOPROL-XL) 24 hr tablet 50 mg  50 mg Oral Daily Forde Dandy, MD   50 mg at 12/08/16 1210   Current Outpatient Prescriptions  Medication Sig Dispense Refill  . irbesartan (AVAPRO) 300 MG tablet Take 300 mg by mouth at bedtime.    Marland Kitchen LORazepam (ATIVAN) 1 MG tablet Take 1 mg by mouth every 8 (eight) hours. For anxity    . metoprolol succinate (TOPROL-XL) 50 MG 24 hr tablet Take 50 mg by mouth daily. Take with or immediately following a meal.      Musculoskeletal: Strength & Muscle Tone: within normal limits Gait & Station: unsteady Patient leans: N/A  Psychiatric Specialty Exam: Physical Exam  Vitals reviewed.   Review of Systems  Unable to perform ROS: Dementia (pt confused)    Blood pressure 98/67, pulse 85, temperature 97.9 F (36.6 C), temperature source Oral, resp. rate 18, height 6' 1"  (1.854 m), weight 77.1 kg (169 lb 14.4 oz), SpO2 96 %.Body mass index is 22.42 kg/m.  General Appearance: Disheveled  Eye Contact:  Minimal  Speech:  Clear and Coherent and Slow  Volume:  Decreased  Mood:  Anxious and Irritable  Affect:  Labile  Thought Process:  Disorganized  Orientation:  Self, responds to name only  Thought Content:  Unable to assess, confused, dementia  Suicidal Thoughts:  Unable to assess, confused, dementia  Homicidal Thoughts:  Unable to assess, confused, dementia  Memory:  Unable to assess, confused, dementia  Judgement:  Unable to assess, confused, dementia  Insight:  Unable to assess, confused, dementia  Psychomotor Activity:  Increased  Concentration:  Concentration: Poor and Attention Span: Poor  Recall:  Poor  Fund of Knowledge:  Fair  Language:  Fair  Akathisia:  No  Handed:    AIMS (if indicated):     Assets:  Communication Skills Desire for  Improvement Resilience Social Support  ADL's:  Impaired  Cognition:  Impaired,  Severe  Sleep:      Treatment Plan Summary: Dementia due to Alzheimer's disease unstable, medication recommendations as below, seek inpatient Gero-psych  Medications: -Please discontinue Haldol (higher risk in elderly and not ideal with dementia) -Please discontinue Ativan (very high risk in this pt and will worsen confusion and fall risk) -Please discontinue Benadryl due to cholinergic pathway (vistaril is better) -Vistaril 56m-50mg po tid prn agitation (may use IM if necessary) -Risperidone 0.540mpo bid (may use M-tabs for ease of administration)  -Try to get 12-lead EKG for baseline  Sara Chu (daughter says pt has always had excellent cardiac exams) *Please call us if pt worsens so that we may provide further assistance   Disposition: -Would be good candidate for SNF although Clarisa Fling will provide stabilization while seeking SNF and pt's HCPOA (daughter Vicente Males) is receptive to this.  Benjamine Mola, [place] 12/10/2016 10:53 AM

## 2016-12-10 NOTE — ED Notes (Addendum)
Pt daughter left

## 2016-12-10 NOTE — ED Notes (Signed)
Pt went to sleep 

## 2016-12-10 NOTE — ED Notes (Signed)
Pt is up

## 2016-12-10 NOTE — ED Notes (Signed)
Pt appears anxious and aggressive with staff family at bedside safety sitter remains in room with pt.

## 2016-12-10 NOTE — ED Notes (Signed)
Daughter at bedside pt stood up with two assist pt still appears anxious and wanting to walk around daughter and sitter reassuring

## 2016-12-10 NOTE — ED Notes (Signed)
Pt continues to be anxious and aggressive with staff now using foul language and attempting to climb out of bed pt unable to be redirected ER MD made aware orders received and carried out safety maintained

## 2016-12-10 NOTE — ED Notes (Addendum)
PT daughter is at bedside pt is a little calm but still wants to get out of here. Pt still is trying to get out of bed

## 2016-12-10 NOTE — ED Notes (Signed)
Corey Hancock and I cleaned up pt and pulled pt up in bed.

## 2016-12-10 NOTE — Progress Notes (Signed)
CSW faxed requested information to [employer].    Enos Fling, MSW, LCSW [facility] ED/51M Clinical Social Worker [phone]

## 2016-12-10 NOTE — ED Notes (Addendum)
Pt daughter at bedside

## 2016-12-10 NOTE — ED Notes (Signed)
Patient continuing to try to get out of bed. EMT/sitter working with patient to keep him in bed.

## 2016-12-10 NOTE — ED Notes (Signed)
Pt daughter at bed side Pt is more calm but continues to ask to get out of here. Pt does start talking bad to daughter about not getting him out of here

## 2016-12-10 NOTE — ED Notes (Signed)
Pt relaxing

## 2016-12-10 NOTE — ED Notes (Signed)
Pt breakfast tray at bedside ?

## 2016-12-10 NOTE — ED Notes (Signed)
PT is getting really aggressive and not trying to stay in bed at all

## 2016-12-10 NOTE — ED Notes (Signed)
Pt turned and positioned pt continues to need to be redirected to stay in the bed

## 2016-12-10 NOTE — ED Notes (Signed)
[facility] MD at bedside spoke with family regarding Medication regimen for pt.  MD to change some medications for better behavior management pt remains anxious and aggressive

## 2016-12-10 NOTE — ED Notes (Signed)
[facility] MD at bedside sitter remains at bedside

## 2016-12-10 NOTE — ED Notes (Signed)
Pt is sleeping

## 2016-12-10 NOTE — ED Notes (Signed)
Pt daughter and Child psychotherapist are talking

## 2016-12-10 NOTE — ED Notes (Signed)
Pt stood up and assisted into wheelchair pt taken for ride in wheelchair around the ER to redirect pt brought back to room and placed back in bed, pt aggressive with staff while trying to be placed back in bed.  Pt placed back in bed with safety sitter at bedside safety maintained

## 2016-12-10 NOTE — ED Notes (Signed)
Pt continues to be anxious and aggressive with staff safety sitter remains at bedside

## 2016-12-10 NOTE — ED Notes (Signed)
As per Daughter [facility] RN to be here tomorrow am between 9-10 am for evaluation

## 2016-12-10 NOTE — ED Notes (Addendum)
Called PT daughter. PT wanted to talk to her. Pt daughter said she will be here and a hour or two

## 2016-12-10 NOTE — ED Notes (Signed)
Im sitting with pt. Pt keep asking to go home and that he needs to get to his daughter

## 2016-12-10 NOTE — ED Notes (Signed)
Pt was getting a little aggressive with me. Pt continues to ask to get out of here. Pt also keep sliding down in bed and asking to me to help him up so he can get out of here. Pt then offer me $1000 to get him up. I decline his offer then he began to tell me that im not a nice person for not taking to money

## 2016-12-10 NOTE — ED Notes (Signed)
Pt wants to talk to daughter

## 2016-12-10 NOTE — ED Notes (Signed)
Pt resting appears comfortable safety sitter remains with pt. Case manager aware of pt's condition and need for SNF placement daughter wants pt. [facility], is arranging for visit from nurse for tomorrow

## 2016-12-10 NOTE — Progress Notes (Signed)
CSW discussed case with TTS, covering CSW, as well as bedside RN. It has been requested that Patient be evaluated by psychiatry for medication recommendations and placement at [facility] until Patient can get on adequate medication regime for SNF placement. Although previous notes indicate SNF bed offers, facilities are unable to take Patient with current aggressive behaviors and agitation. Per TTS, Renata Caprice, FNP is coming to assess Patient at bedside for further recommendations. CSW continues to follow.    Enos Fling, MSW, LCSW [facility] ED/15M Clinical Social Worker [phone]

## 2016-12-10 NOTE — ED Notes (Signed)
Pt in bed becoming increasingly aggressive and agitated swinging at staff members appears very anxious ER MD made aware Ativan orders switched from PO to IM

## 2016-12-10 NOTE — ED Notes (Addendum)
Pt had grabbed and hit me 2X and kicked me. Pt also called me a MF'er. All while me and Jon Gills was trying to get him up on the side of the bed to sit.

## 2016-12-10 NOTE — ED Notes (Addendum)
[facility] Ms MD at bedside. Pt not responding to MD question.

## 2016-12-10 NOTE — ED Notes (Signed)
Pt is awake.

## 2016-12-10 NOTE — ED Notes (Signed)
Cleaned up PT and changed PT sheets

## 2016-12-10 NOTE — ED Notes (Signed)
Daughter to bedside pt appears more calm and is cooperating with daughter waiting on Social Worker for placement for pt.

## 2016-12-10 NOTE — ED Notes (Signed)
Daughter back at bedside pt remains anxious and aggressive safety sitter remains with pt.

## 2016-12-10 NOTE — ED Notes (Signed)
Social Worker to bedside.

## 2016-12-10 NOTE — ED Notes (Signed)
Breakfast tray ordered 

## 2016-12-10 NOTE — ED Notes (Signed)
.  so

## 2016-12-11 NOTE — ED Notes (Signed)
Daughter arrived and stated she wanted tests to be ran on his urine, RN made aware. Assisted pt to standing at the side of the bed and cleaned pt to provide a urine sample, pt did not provide any.

## 2016-12-11 NOTE — ED Notes (Signed)
Spoke with Lorrene Reid from [facility] about possible pt placement. She reports she will get back with me at a later time

## 2016-12-11 NOTE — Progress Notes (Addendum)
Pt has been accepted at [facility] in [place] after a re-review of his chart information.  Accepting physician is Dr. Theotis Barrio, Report can be called to [phone].  CSW contacted [facility] ED nurse, Leeroy Bock to notify.  Pt's daughter was present and CSW asked to speak with her to relay this information. CSW explained that pt had been accepted to [facility] in [place] for stabilization and treatment but would need to have involuntary commitment papers ordered before he is transported by the [employer]. Sheriff's department.  Pt's daughter had strong objections her father going to [facility] or any psychiatric hospital stating, "Absolutely not.  I can't have him going so far away.  I am the only caregiver for him and my sister who has Down Syndrome. He does not need to go there. He is much better today since he had his medications adjusted.  He is not agitated"  CSW suggested that Pt's daughter ask to speak with the attending physician to express her concerns. Last [facility] disposition, by TTS NP [place], indicates that the patient "Would be good candidate for SNF although Gust Rung will provide stabilization while seeking SNF."    CSW conferred with TTS NP Withrow, who not able to determine if patient can, or should, now be IVC'd as he has not been asked to reassess the pt. .   CSW spoke with pt's nurse who does not endorse that the pt. Is no longer agitated but is now pushing staff rather than trying to strike or kick them. Pt's nurse to talk to attending physician regarding pt status to determine if pt. meets criteria for IVC.  CSW staffed this information with TTS NP,  CSW Chiropodist, and CSW handling [facility] ED social work needs today.  Updated disposition: Per [facility] ED Nurse, EDP does not feel that the pt meets criteria for being involuntarily committed.  Pt. to be d/c'd to daughter's care.  Daughter  is aware that her father can not be held in the ED awaiting SNF placement and will pick him up at  discharge. Disposition CSW notified [facility] CSW and [employer].  Timmothy Euler. Kaylyn Lim, MSW, LCSWA Clinical Social Work Disposition [phone]

## 2016-12-11 NOTE — ED Notes (Addendum)
Per Carney Bern, [facility] - SW, pt has been declined at [facility] and Art therapist. States is going to try [facility]. Nile Dear, Charge RN, aware.

## 2016-12-11 NOTE — ED Notes (Signed)
Spoke w/Tina, AC, [facility] - who advised C Winthrow, DNP, assessed pt yesterday and recommended to d/c benzos and administer Vistaril po or IM prn and Rispiridone. Per TTS report sheet, [facility] declined pt d/t aggression. [facility] staff member coming to assess pt at 0900 to see if appropriate for their facility. Evlyn Kanner, RN, aware and advised will attempt to administer po meds w/food.

## 2016-12-11 NOTE — ED Notes (Signed)
PT CAME TO SEE PT.WALK AROUND THE POD A.THEY PLACED PT.IN BED NOW SLEEPING

## 2016-12-11 NOTE — ED Notes (Signed)
CSW paged to follow up on pt placement status

## 2016-12-11 NOTE — Progress Notes (Signed)
CSW received call from [facility] staff person, Donnelly Angelica, [phone]) who was inquiring as to pt's ADL status as a consideration for acceptance.  CSW suggested that she speak directly with his [facility] nurse.  CSW contacted [facility] ED Nurse, Leeroy Bock and gave her Ms. Elmer Bales contact information.  CSW will follow up with [facility] regarding a possible placement.  Timmothy Euler. Kaylyn Lim, MSW, LCSWA Clinical Social Work Disposition [phone]

## 2016-12-11 NOTE — ED Notes (Signed)
Pt redirected back into the bed after trying to leave the room

## 2016-12-11 NOTE — ED Notes (Signed)
No falls while in ED however pt daughter reports multiple falls at home. Pt does present with unsteady gait at times in ED. One person assist

## 2016-12-11 NOTE — ED Notes (Signed)
Pt changed and redirected to the chair beside the bed.

## 2016-12-11 NOTE — ED Notes (Addendum)
Pt assisted to side of bed and then to chair at side of bed for lunch, pt stated he was not hungry and that he was not going to eat. Pt up and walking around room with assistance stating he wanted to go home, asked pt If he needed to use the restroom, pt stated he did not. Pt seems very agitated, RN made aware.

## 2016-12-11 NOTE — ED Notes (Signed)
Pt.is calm this morning .

## 2016-12-11 NOTE — ED Notes (Signed)
Pt assisted to standing beside of bed, stated he needed to void. Pt changed from an incontinent void and bm assisted back into bed and repositioned.

## 2016-12-11 NOTE — ED Notes (Signed)
Pt changed from a bm and repositioned in bed.

## 2016-12-11 NOTE — Progress Notes (Signed)
qPhysical Therapy Treatment Patient Details Name: Corey Hancock MRN: [id number] DOB: 1929/10/11 Today's Date: 12/11/2016    History of Present Illness 83 year old male with history of dementia, DM and hypertension. History is obtained from EMS who states that they were called today due to frequent falls and urinary frequency at home. PMH: DM,HTN    PT Comments    Pt admitted with above diagnosis. Pt currently with functional limitations due to balance and endurance deficits. Pt ambulated with +2 HHA varying between min and mod as he fatigued.  Daughter present and she stated on arrival that they changed pts meds and he was much better.  Daughter encouraged pt to get up.  Takes incr time to motivate pt at times but he was appropriate for the most part with his walking.  Pt will benefit from skilled PT to increase their independence and safety with mobility to allow discharge to the venue listed below.    Follow Up Recommendations  SNF;Supervision/Assistance - 24 hour     Equipment Recommendations  Rolling walker with 5" wheels;3in1 (PT)    Recommendations for Other Services       Precautions / Restrictions Precautions Precautions: Fall Restrictions Weight Bearing Restrictions: No    Mobility  Bed Mobility Overal bed mobility: Needs Assistance Bed Mobility: Sit to Supine       Sit to supine: Min guard   General bed mobility comments: able to place LEs in bed without assist. Cues needed to scoot over in bed as well as assist.    Transfers Overall transfer level: Needs assistance Equipment used: 2 person hand held assist Transfers: Sit to/from Stand Sit to Stand: Min assist;+2 physical assistance         General transfer comment: Pt needs +2 min assist for steadying and to guide pt to walk.   Ambulation/Gait Ambulation/Gait assistance: Min assist;Mod assist;+2 physical assistance Ambulation Distance (Feet): 250 Feet Assistive device: 2 person hand held  assist Gait Pattern/deviations: Decreased step length - left;Decreased step length - right;Decreased stride length;Shuffle;Staggering left;Staggering right;Trunk flexed   Gait velocity interpretation: Below normal speed for age/gender General Gait Details: Pt takes small steps needing assist for stability.  Pt holding bil hands with therapist and rehab tech supporting pt.  Pt slow moving.  Pt unsteady at times.  Has to be redirected frequently.  At one point, pt began to lean over more needing incr assist.  Pt not walking as well either.  Pt began cursing about his legs.  Redirected pt that he could lie down and his bed was in sight.  Pt cursed a little and was slightly agitated but then PT told him he could go back to bed and pt began walking again and walked straight to bed.     Stairs            Wheelchair Mobility    Modified Rankin (Stroke Patients Only)       Balance Overall balance assessment: Needs assistance;History of Falls Sitting-balance support: No upper extremity supported;Feet supported Sitting balance-Leahy Scale: Fair Sitting balance - Comments: can sit statically without UE support.   Standing balance support: Bilateral upper extremity supported;During functional activity Standing balance-Leahy Scale: Poor Standing balance comment: relies on UE support.  Unsteady on feet             High level balance activites: Direction changes;Backward walking;Turns;Sudden stops High Level Balance Comments: min assist of 2 for high level activity.  Cognition Arousal/Alertness: Lethargic;Suspect due to medications (just had haldol per nurse) Behavior During Therapy: Anxious;Restless;Impulsive Overall Cognitive Status: No family/caregiver present to determine baseline cognitive functioning                                 General Comments: per chart, pt demented.  Pt confused.  Oriented to self only.       Exercises      General  Comments        Pertinent Vitals/Pain Pain Assessment: No/denies pain   VSS Home Living                      Prior Function            PT Goals (current goals can now be found in the care plan section) Progress towards PT goals: Progressing toward goals    Frequency    Min 2X/week      PT Plan Current plan remains appropriate    Co-evaluation             End of Session Equipment Utilized During Treatment: Gait belt Activity Tolerance: Patient limited by fatigue (limited by confusion) Patient left: in bed;with call bell/phone within reach;with bed alarm set;with family/visitor present Nurse Communication: Mobility status PT Visit Diagnosis: Unsteadiness on feet (R26.81);Muscle weakness (generalized) (M62.81);History of falling (Z91.81)     Time: 1610-9604 PT Time Calculation (min) (ACUTE ONLY): 17 min  Charges:  $Gait Training: 8-22 mins                    G Codes:       Bari Handshoe,PT Acute Rehabilitation [phone] (pager)    Berline Lopes 12/11/2016, 10:16 AM

## 2016-12-11 NOTE — ED Notes (Signed)
Spoke with Morrie Sheldon CSW about pt placement status. She states she will call [facility] to follow up. She reports Pt daughter was actually the one communicating with [facility] and set up time for them to come meet with pt.

## 2016-12-11 NOTE — ED Notes (Signed)
Dr. Criss Alvine made aware of pt agitation. No additional orders at this time. Will continue to monitor pt behavior

## 2016-12-11 NOTE — ED Notes (Signed)
Family at bedside.pt.is sleeping at this time,

## 2016-12-11 NOTE — ED Notes (Signed)
Spoke with Corey Hancock at [facility]. [facility] however daughter is refusing to accept this disposition. PT daughter states she would rather take him with her than send him to [facility] d/t having handicap sister at home. Corey Hancock reports EDP should either IVC pt and send to [facility] or the pt could be d/c home to daughters care. EDP speaking with pt daughter about disposition on phone

## 2016-12-11 NOTE — ED Provider Notes (Signed)
Patient has still be intermittently agitated/confused. Able to be re-directed by staff. Will continue meds, no indication of severe agitation needing acute IM/IV control. Daughter asks for repeat urine, citing frequency and changing mental status. VSS. SW continuing to look for placement   Pricilla Loveless, MD 12/11/16 1754

## 2016-12-11 NOTE — ED Notes (Signed)
Sitter at bedside.

## 2016-12-11 NOTE — ED Notes (Signed)
Pt assisted to side of bed and assisted with urinal. Pt then changed from an incontinent bm. Redirected pt back into bed.

## 2016-12-11 NOTE — ED Notes (Signed)
Pt assisted to standing beside the bed and assisted with urinal, pt then stated he wanted to leave and asked where his shoes were. Redirected pt back into bed.

## 2016-12-11 NOTE — ED Notes (Signed)
Pt up and walking around room, pt getting in face telling me he's leaving. Redirected pt to get back in the chair.

## 2016-12-11 NOTE — ED Provider Notes (Signed)
Assumed care of this patient at 3 PM. Patient has a long, complicated ER stay due to difficulty with placement as well as arranging appropriate discharge plan with family. Patient has been in the ER over 80 hours at this time. I reviewed psychiatry as well as social work notes. Patient has now been [facility] [place]. However, patient's daughter is refusing to send patient to this facility. I had a long, thorough discussion with the patient's daughter regarding his plan of care. After a long discussion, she is now willing to take the patient home for further management. She feels he has mildly improved after stopping Ativan and starting Risperdal. However, she is concerned about his safety at the home. She mentions a history of PTSD and feels some of his symptoms are secondary to his PTSD/psychiatric disease. I have asked psychiatry to reevaluate emergently for determination if patient does meet IVC criteria, as per review of records, they have intermittently stated that he does or does not need inpatient treatment and IVC. However, on discussion with the current PA at behavioral health, he does not feel comfortable clearing the patient at this time. He feels that a psychiatrist needs to evaluate the patient in the morning to determine safety for discharge. He has assured me that a psychiatrist will see the patient early in the morning. I discussed this with nursing as well as the patient and daughter. While I do not feel patient is currently benefiting from ongoing ER stay, given behavioral health's hesitancy to clear the patient and perform a definitive plan regarding need for IVC, I do not feel comfortable clearing the patient at this time. Patient will be emergently reevaluated by psychiatrist to determine IVC placement. Current plan is to send to [facility] under [facility] if patient does meet IVC criteria. If patient does not meet these criteria, he will be discharged in the morning to his  daughter's home. I have had social worker Burna Mortimer evaluate as well, and face-to-face sheet has been provided with home health resources.   Shaune Pollack, MD 12/12/16 [phone]

## 2016-12-11 NOTE — Progress Notes (Signed)
CSW spoke with admissions coordinator, Aniceto Boss [phone]), regarding Patient's status. Per Aniceto Boss, she has been in communication with Patient's daughter and reports that they have not yet provided a bed offer. Per Aniceto Boss, Patient needs to be medically monitored on his new medication regime and reports that [facility] is unable to provide that type of medication monitioring at an assisted living facility. Alma reports that she has discussed this with Patient's daughter and informed Patient's daughter that they would not be coming to assess Patient at this time. Alma suggests that Patient go to a geri-psych facility for medication monitoring and medication management until he is stable enough to discharge to a SNF or ALF. CSW has staffed this information with [facility] Disposition CSW. CSW has also staffed case with CSW Chiropodist in [employer] meeting who also suggested geri-psych placement. CSW continues to follow.    Enos Fling, MSW, LCSW [facility] ED/63M Clinical Social Worker [phone]

## 2016-12-11 NOTE — ED Notes (Signed)
Spoke with [facility] and told that pt was accepted at [facility] in [place]. [facility] on phone with daughter at this time. Daughter is stating he can not go to . Daughter requesting pt go to [facility] again at this time.

## 2016-12-12 ENCOUNTER — Emergency Department (HOSPITAL_COMMUNITY)
Admission: EM | Admit: 2016-12-12 | Discharge: 2016-12-13 | Disposition: A | Discharge status: Home / Self Care | Payer: Medicare Other | Source: Home / Self Care

## 2016-12-12 ENCOUNTER — Encounter (HOSPITAL_COMMUNITY): Payer: Self-pay | Admitting: Emergency Medicine

## 2016-12-12 DIAGNOSIS — F0391 Unspecified dementia with behavioral disturbance: Secondary | ICD-10-CM | POA: Diagnosis not present

## 2016-12-12 DIAGNOSIS — I10 Essential (primary) hypertension: Secondary | ICD-10-CM

## 2016-12-12 DIAGNOSIS — F0281 Dementia in other diseases classified elsewhere with behavioral disturbance: Secondary | ICD-10-CM

## 2016-12-12 DIAGNOSIS — Z79899 Other long term (current) drug therapy: Secondary | ICD-10-CM

## 2016-12-12 DIAGNOSIS — G309 Alzheimer's disease, unspecified: Secondary | ICD-10-CM

## 2016-12-12 HISTORY — DX: Dementia in other diseases classified elsewhere, unspecified severity, without behavioral disturbance, psychotic disturbance, mood disturbance, and anxiety: F02.80

## 2016-12-12 HISTORY — DX: Alzheimer's disease, unspecified: G30.9

## 2016-12-12 LAB — QUANTIFERON IN TUBE
QFT TB AG MINUS NIL VALUE: 0.01 [IU]/mL
QUANTIFERON MITOGEN VALUE: 6.03 [IU]/mL
QUANTIFERON NIL VALUE: 0.04 [IU]/mL
QUANTIFERON TB AG VALUE: 0.05 [IU]/mL
QUANTIFERON TB GOLD: NEGATIVE

## 2016-12-12 LAB — URINALYSIS, ROUTINE W REFLEX MICROSCOPIC
Bacteria, UA: NONE SEEN
Bilirubin Urine: NEGATIVE
Glucose, UA: >=500 mg/dL — AB
Hgb urine dipstick: NEGATIVE
Ketones, ur: 5 mg/dL — AB
Leukocytes, UA: NEGATIVE
Nitrite: NEGATIVE
PH: 5 (ref 5.0–8.0)
Protein, ur: NEGATIVE mg/dL
SPECIFIC GRAVITY, URINE: 1.017 (ref 1.005–1.030)
Squamous Epithelial / HPF: NONE SEEN

## 2016-12-12 LAB — QUANTIFERON TB GOLD ASSAY (BLOOD)

## 2016-12-12 MED ORDER — HYDROXYZINE HCL 25 MG PO TABS: 25.0000 mg | ORAL_TABLET | Freq: Every day | ORAL | 0 refills | Status: AC

## 2016-12-12 MED ORDER — RISPERIDONE 0.5 MG PO TABS: 0.5000 mg | ORAL_TABLET | Freq: Every day | ORAL | 0 refills | Status: AC

## 2016-12-12 MED ORDER — LORAZEPAM 1 MG PO TABS
1.0000 mg | ORAL_TABLET | Freq: Three times a day (TID) | ORAL | Status: DC
  Administered 2016-12-12 – 2016-12-13 (×3): 1 mg via ORAL
  Filled 2016-12-12 (×3): qty 1

## 2016-12-12 MED ORDER — RISPERIDONE 0.5 MG PO TABS: 0.5000 mg | ORAL_TABLET | Freq: Every day | ORAL | 0 refills | Status: AC | PRN

## 2016-12-12 MED ORDER — IRBESARTAN 300 MG PO TABS
300.0000 mg | ORAL_TABLET | Freq: Every day | ORAL | Status: DC
  Filled 2016-12-12 (×2): qty 1

## 2016-12-12 MED ORDER — RISPERIDONE 0.5 MG PO TABS
0.5000 mg | ORAL_TABLET | Freq: Every day | ORAL | Status: DC
  Administered 2016-12-13: 0.5 mg via ORAL
  Filled 2016-12-12: qty 1

## 2016-12-12 MED ORDER — HYDROXYZINE HCL 50 MG PO TABS
50.0000 mg | ORAL_TABLET | Freq: Three times a day (TID) | ORAL | Status: DC
  Administered 2016-12-12 – 2016-12-13 (×3): 50 mg via ORAL
  Filled 2016-12-12 (×3): qty 1

## 2016-12-12 MED ORDER — HYDROXYZINE HCL 25 MG PO TABS
25.0000 mg | ORAL_TABLET | Freq: Two times a day (BID) | ORAL | 0 refills | Status: AC | PRN
Start: 2016-12-12 — End: ?

## 2016-12-12 MED ORDER — RISPERIDONE 0.5 MG PO TABS
0.5000 mg | ORAL_TABLET | Freq: Every day | ORAL | Status: DC
Start: 2016-12-12 — End: 2016-12-13
  Administered 2016-12-12: 0.5 mg via ORAL
  Filled 2016-12-12: qty 1

## 2016-12-12 MED ORDER — METOPROLOL SUCCINATE ER 25 MG PO TB24
50.0000 mg | ORAL_TABLET | Freq: Every day | ORAL | Status: DC
  Administered 2016-12-13: 50 mg via ORAL
  Filled 2016-12-12: qty 2
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
## 2016-12-12 NOTE — ED Notes (Signed)
Pt is resting at this time

## 2016-12-12 NOTE — Discharge Planning (Signed)
Delonta Yohannes J. Lucretia Roers, RN, BSN, [employer] [phone] Spoke with pt at bedside regarding discharge planning for [facility]. Offered pt list of home health agencies to choose from.  Pt chose [facility] to render services. Avie Echevaria, RN of [facility] notified. Patient made aware that [facility] will be in contact in 24-48 hours.  No DME needs identified at this time.  Oletta Cohn, RN, BSN, [place] [phone] Pt qualifies for DME rolling walker, 3n1, hospital bed, and wheelchair.  DME  ordered through [facility].  Shaune Leeks of [facility] notified to deliver to pt at home.

## 2016-12-12 NOTE — ED Notes (Signed)
Changed the pt's brief and cleaned the pt, with the assistance of [place], [place].

## 2016-12-12 NOTE — ED Notes (Signed)
Pt placed in recliner and given pen and paper

## 2016-12-12 NOTE — ED Notes (Signed)
Discussed D/c instructions and prescriptions with pt's dtr. Pt safe for d/c. Will wheel pt to car and assist with pt getting into car

## 2016-12-12 NOTE — Progress Notes (Signed)
CSW engaged with Patient's daughter outside of Patient's room. CSW provided Patient's daughter with list of skilled nursing facilities, assisted living facilities, family care homes, and group homes. CSW also provided Patient with list of private duty nursing facilities. CSW has confirmed with RN Case Manager that home health has been set up and medical equipment has been ordered. Order placed for 3 n 1, rolling walker, standard manual wheelchair with seat cushion, hospital bed, and home health PT, OT, Aide, SW, and RN. CSW encouraged Patient's daughter to contact private duty care agencies for assistance in the home. CSW signing off at this time.    Enos Fling, MSW, LCSW [facility] ED/22M Clinical Social Worker [phone]

## 2016-12-12 NOTE — ED Notes (Signed)
Pt.woke up very confuse and wet diaper and small stool .clean pt. Up and place clean dry diaper on .set up for pt.to brush  His teeth and wash his face before breakfast.

## 2016-12-12 NOTE — ED Notes (Signed)
Pt received Avapro 4/3 @ 2314pm.                    Atarax 4/3 @ 1013am                    Toprol - XL 4/4 @ 1005am                    Risperadol 4/4 

## 2016-12-12 NOTE — ED Provider Notes (Signed)
[facility]-EMERGENCY DEPT Provider Note   CSN: [id number] Arrival date & time: 12/12/16  1708     History   Chief Complaint No chief complaint on file.   HPI Corey Hancock is a 83 y.o. male.  HPI  83 y.o.This is an 83 year old man who was discharged just discharged from the ED after an extended 5 day stay here in the emergency department. He had come in extremely agitated. He received psychiatric consultation and social work and case management care while here. He had multiple medications started and eventually appeared to have his mood stabilized on Remeron. They had advised that the [facility] could be [facility] [place] but he needed IVC for this. The daughter reportedly was adamantly opposed to this. Psych eventually cleared that the patient and he was discharged today with the daughter to her home. After 1 hour, she called EMS to bring the patient back to the emergency department. Here he is awake and alert and calm and cooperative with [place].  Past Medical History:  Diagnosis Date  . Hypertension     Patient Active Problem List   Diagnosis Date Noted  . Dementia due to Alzheimer's disease 12/09/2016  . Alzheimer disease     No past surgical history on file.     Home Medications    Prior to Admission medications   Medication Sig Start Date End Date Taking? Authorizing Provider  hydrOXYzine (ATARAX/VISTARIL) 25 MG tablet Take 1 tablet (25 mg total) by mouth 2 (two) times daily as needed. 12/12/16   Courteney Lyn Mackuen, MD  hydrOXYzine (ATARAX/VISTARIL) 25 MG tablet Take 1 tablet (25 mg total) by mouth at bedtime. 12/12/16   Courteney Lyn Mackuen, MD  irbesartan (AVAPRO) 300 MG tablet Take 300 mg by mouth at bedtime.    Historical Provider, MD  LORazepam (ATIVAN) 1 MG tablet Take 1 mg by mouth every 8 (eight) hours. For anxity    Historical Provider, MD  metoprolol succinate (TOPROL-XL) 50 MG 24 hr tablet Take 50 mg by mouth daily. Take with or immediately following  a meal.    Historical Provider, MD  risperiDONE (RISPERDAL) 0.5 MG tablet Take 1 tablet (0.5 mg total) by mouth at bedtime. 12/12/16   Courteney Lyn Mackuen, MD  risperiDONE (RISPERDAL) 0.5 MG tablet Take 1 tablet (0.5 mg total) by mouth daily as needed. 12/12/16   Courteney Lyn Mackuen, MD    Family History No family history on file.  Social History Social History  Substance Use Topics  . Smoking status: Never Smoker  . Smokeless tobacco: Not on file  . Alcohol use No     Allergies   Patient has no known allergies.   Review of Systems Review of Systems  Unable to perform ROS: Dementia     Physical Exam Updated Vital Signs There were no vitals taken for this visit.  Physical Exam  Constitutional: He appears well-developed and well-nourished. No distress.  HENT:  Head: Normocephalic and atraumatic.  Eyes: EOM are normal. Pupils are equal, round, and reactive to light.  Neck: Normal range of motion.  Cardiovascular: Normal rate.   Pulmonary/Chest: Effort normal.  Abdominal: Soft.  Musculoskeletal: Normal range of motion.  Contusion that appears several days old and left lateral low back without tenderness to palpation  Neurological: He is alert.  No focal neurological deficits are noted.  Skin: Skin is warm and dry.  Psychiatric: He has a normal mood and affect.  Patient is calm and pleasant and cooperative with examiner  Vitals reviewed.  Normal   ED Treatments / Results  Labs (all labs ordered are listed, but only abnormal results are displayed) Labs Reviewed - No data to display  EKG  EKG Interpretation None       Radiology No results found.  Procedures Procedures (including critical care time)  Medications Ordered in ED Medications - No data to display   Initial Impression / Assessment and Plan / ED Course  I have reviewed the triage vital signs and the nursing notes.  Pertinent labs & imaging results that were available during my care of the  patient were reviewed by me and considered in my medical decision making (see chart for details).     83 year old man with dementia who is returned from home after discharge today. He is medically cleared. I have discussed patient's care with social work. They state that there are 2 skilled nursing facilities currently will take the patient. However, the daughter does not want to go to either disease. He is currently cleared medically for discharge. Social work states that they will speak to the daughter. She can have the patient admitted to a skilled nursing facility. He has no indication this time for medical admission or further psychiatric evaluation and is cleared for discharge. Final Clinical Impressions(s) / ED Diagnoses   Final diagnoses:  Dementia with behavioral disturbance, unspecified dementia type    New Prescriptions New Prescriptions   No medications on file     Margarita Grizzle, MD 12/12/16 2334

## 2016-12-12 NOTE — ED Notes (Signed)
Patient given bed time snack and drink.

## 2016-12-12 NOTE — ED Notes (Signed)
Patient was incontinent, patient cleaned up and changed at 21:00.

## 2016-12-12 NOTE — ED Notes (Signed)
Patient sleeping

## 2016-12-12 NOTE — Consult Note (Signed)
[facility] Face-to-Face Psychiatry Consult   Reason for Consult:  Dementia, Behavioral changes Referring Physician:  EDP Patient Identification: Corey Hancock MRN:  [id number] Principal Diagnosis: Dementia due to Alzheimer's disease Diagnosis:   Patient Active Problem List   Diagnosis Date Noted  . Dementia due to Alzheimer's disease [G30.9, F02.80] 12/09/2016  . Alzheimer disease [G30.9]    Total Time spent with patient: 60 minutes   Subjective:   Corey Hancock is a 83 y.o. male with the increased drowsiness, confusion and frequent falls after received lorazepam and Benadryl from primary care physician for dementia related behavior problems   History of present illness: Patient seen, chart reviewed and case discussed with patient daughter who is at bedside, staff nurse and emergency department physician. Patient is awake, alert but has limited orientation to his first name, last name and able to recognize his daughter who is at bedside. Patient has significant cognitive decline for the last 6 months to one year. Reportedly patient has been independent and able to drive around until 6 months ago. Patient daughter complained that the initial medication of Haldol and lorazepam caused him more confused and acting out more. His current medication risperidone 0.5 mg twice daily and also hydroxyzine helping him to calm himself and back to his baseline. Patient was offered skilled nursing facility with the memory unit. Patient daughter decided to take him home with their current medication management and follow-up with case manager regarding possible better skilled nursing facility with the memory unit then 1 offered for her during this emergency department visit. She is HCPOA and paperwork on physical chart at [facility].   Past Psychiatric History: Patient has a history of dementia with behavioral problems 6 months to one year but no previous acute psychiatric hospitalizations or outpatient  treatment.  Risk to Self: Is patient at risk for suicide?: No Risk to Others:   Prior Inpatient Therapy:   Prior Outpatient Therapy:    Past Medical History:  Past Medical History:  Diagnosis Date  . Hypertension    No past surgical history on file. Family History: No family history on file. Family Psychiatric  History: denies Social History:  History  Alcohol Use No     History  Drug Use No    Social History   Social History  . Marital status: Married    Spouse name: N/A  . Number of children: N/A  . Years of education: N/A   Social History Main Topics  . Smoking status: Never Smoker  . Smokeless tobacco: Not on file  . Alcohol use No  . Drug use: No  . Sexual activity: Not on file   Other Topics Concern  . Not on file   Social History Narrative  . No narrative on file   Additional Social History:    Allergies:  No Known Allergies  Labs:  Results for orders placed or performed during the hospital encounter of 12/07/16 (from the past 48 hour(s))  Urinalysis, Routine w reflex microscopic     Status: Abnormal   Collection Time: 12/11/16  2:04 PM  Result Value Ref Range   Color, Urine YELLOW YELLOW   APPearance CLEAR CLEAR   Specific Gravity, Urine 1.017 1.005 - 1.030   pH 5.0 5.0 - 8.0   Glucose, UA >=500 (A) NEGATIVE mg/dL   Hgb urine dipstick NEGATIVE NEGATIVE   Bilirubin Urine NEGATIVE NEGATIVE   Ketones, ur 5 (A) NEGATIVE mg/dL   Protein, ur NEGATIVE NEGATIVE mg/dL   Nitrite NEGATIVE NEGATIVE  Leukocytes, UA NEGATIVE NEGATIVE   RBC / HPF 0-5 0 - 5 RBC/hpf   WBC, UA 0-5 0 - 5 WBC/hpf   Bacteria, UA NONE SEEN NONE SEEN   Squamous Epithelial / LPF NONE SEEN NONE SEEN   Mucous PRESENT    Hyaline Casts, UA PRESENT     Current Facility-Administered Medications  Medication Dose Route Frequency ProvShaune Pollackate Last Dose  . acetaminophen (TYLENOL) tablet 650 mg  650 mg Oral Q4H PRN Cameron Isaacs, MD      . acetaminophen (TYLENOL) tablet 650 mg   650 mg Oral Once Courteney Lyn Mackuen, MD      . alum & mag hydroxide-simeth (MAALOX/MYLANTA) 200-200-20 MG/5ML suspension 30 mL  30 mL Oral PRN Shaune Pollack, MD      . hydrOXYzine (ATARAX/VISTARIL) tablet 50 mg  50 mg Oral TID PRN Beau Fanny, FNP   50 mg at 12/11/16 1013   Or  . hydrOXYzine (VISTARIL) injection 50 mg  50 mg Intramuscular TID PRN Beau Fanny, FNP   50 mg at 12/10/16 2359  . irbesartan (AVAPRO) tablet 300 mg  300 mg Oral QHS Lavera Guise, MD   300 mg at 12/11/16 2314  . metoprolol succinate (TOPROL-XL) 24 hr tablet 50 mg  50 mg Oral Daily Lavera Guise, MD   50 mg at 12/12/16 1005  . risperiDONE (RISPERDAL M-TABS) disintegrating tablet 0.5 mg  0.5 mg Oral BID Beau Fanny, FNP   0.5 mg at 12/12/16 1005   Current Outpatient Prescriptions  Medication Sig Dispense Refill  . irbesartan (AVAPRO) 300 MG tablet Take 300 mg by mouth at bedtime.    Marland Kitchen LORazepam (ATIVAN) 1 MG tablet Take 1 mg by mouth every 8 (eight) hours. For anxity    . metoprolol succinate (TOPROL-XL) 50 MG 24 hr tablet Take 50 mg by mouth daily. Take with or immediately following a meal.    . hydrOXYzine (ATARAX/VISTARIL) 25 MG tablet Take 1 tablet (25 mg total) by mouth 2 (two) times daily as needed. 25 tablet 0  . hydrOXYzine (ATARAX/VISTARIL) 25 MG tablet Take 1 tablet (25 mg total) by mouth at bedtime. 15 tablet 0  . risperiDONE (RISPERDAL) 0.5 MG tablet Take 1 tablet (0.5 mg total) by mouth at bedtime. 15 tablet 0  . risperiDONE (RISPERDAL) 0.5 MG tablet Take 1 tablet (0.5 mg total) by mouth daily as needed. 15 tablet 0    Musculoskeletal: Strength & Muscle Tone: within normal limits Gait & Station: unsteady Patient leans: N/A  Psychiatric Specialty Exam: Physical Exam  Vitals reviewed.   Review of Systems  Unable to perform ROS: Dementia (pt confused)    Blood pressure 122/78, pulse 71, temperature 97.9 F (36.6 C), temperature source Oral, resp. rate 16, height  (1.854 m), weight 77.1  kg (169 lb 14.4 oz), SpO2 97 %.Body mass index is 22.42 kg/m.  General Appearance: Casual  Eye Contact:  Good  Speech:  Clear and Coherent and Slow  Volume:  Normal  Mood:  Anxious  Affect:  Non-Congruent  Thought Process:  Irrelevant  Orientation:  Self, responds to name only, able to recognize his daughter  Thought Content:  Unable to assess, confused, dementia  Suicidal Thoughts:  Unable to assess, confused, dementia  Homicidal Thoughts:  Unable to assess, confused, dementia  Memory:  Unable to assess, confused, dementia  Judgement:  Unable to assess, confused, dementia  Insight:  Unable to assess, confused, dementia  Psychomotor Activity:  Increased  Concentration:  Concentration: Poor and Attention Span: Poor  Recall:  Poor  Fund of Knowledge:  Fair  Language:  Fair  Akathisia:  No  Handed:    AIMS (if indicated):     Assets:  Communication Skills Desire for Improvement Resilience Social Support  ADL's:  Impaired  Cognition:  Impaired,  Severe  Sleep:      Treatment Plan Summary: 83 years old veteran came to the emergency department secondary to frequent falls and progressively increased confusion over 6 months to one year. Patient has mild-to-moderate behavioral problems which required medication management in the emergency department. Patient does not meet criteria for acute psychiatric hospitalization so patient will be referred to the outpatient medication management and possibly required skilled nursing facility family cannot care for him at home.  Dementia due to Alzheimer's disease  Case discussed with the patient daughter who is at bedside, staff RN and emergency department physician and provided below medication for controlling anxiety, behavior problems and also recommended out-of-home placement when he was not able to be managed at home.  Medications: Vistaril -  po tid prn agitation and 50 mg at bedtime for insomnia  Risperidone 0.5mg  M-tabs at bedtime  for agitation and other behavioral problems associated with the dementia and may use as needed during daytime    Disposition: Patient was offered to be in different skilled nursing facility with [facility] but patient daughter was not happy with the ratings and wants to work with case manager to seek appropriate and safe place for him.  She is willing to take him home with the medication recommendation today.   Leata Mouse, MD 12/12/2016 3:52 PM

## 2016-12-12 NOTE — Progress Notes (Signed)
CSW spoke with pt's daughter, Corey Hancock, twice this pm re: pt's disposition.  Both times, Corey Hancock maintained that she was told by pt's PCP, Dr. Mardelle Matte, that she should bring him into the ED because he had a UTI and required admission. She also stated that pt was "fine" upon leaving the ED, but took off his diaper and pants in the parking lot of the pharmacy when she was getting his meds filled.  Upon taking pt home, he began to get aggressive and combative, which prompted her call to Dr. Mardelle Matte.  CSW explained that the EDP did not have medical findings that warrant a medical admission and pt would not be admitted to the hospital unless admission was medically necessary.  Corey Hancock continues to refuse the 2 bed offers that pt has been extended, stating that she would not private pay for "one star facilities" or facilities multiple citations for abuse/neglect of pts.  Corey Hancock states that she is going to contact the [place] to assist with pt placement, as pt is a Cytogeneticist.  CSW explained that pt would not be able to stay in [facility] ED [facility] and  This could be pursued from home with the assistance of a HHSW.  CSW reminded Corey Hancock that private duty care options had been discussed with her, and that she had been provided with a private duty list.  CSW explained to Corey Hancock that she needed to have a plan in place for pt by tomorrow, or APS would be contacted and a report of neglect would be filed against her.  Corey Hancock maintained that she will be arranging care for pt and will be coming to get him tomorrow afternoon.  Case discussed, at length, with Charge RN, bedside RN, MD, RNCM, and CSW AD.  ED dayshift CSW will be updated and will f/u in am.

## 2016-12-12 NOTE — ED Notes (Signed)
Pt cleaned of incontinence, bed linens changed pt agitated EDP made aware

## 2016-12-12 NOTE — Discharge Instructions (Signed)
You feel safe taking your father home. We want you to take risperadol and hydoxyzine AT NIGHT EVERY NIGHT.  Then you can take it one more time.  If during the day he becomes agitated you may take hydroxyzine up to TWICE more daily and risperadol once more daily.  The social worker has given you more information about where he can go if you can not care for him at home.

## 2016-12-12 NOTE — ED Notes (Signed)
Corey Hancock and me cleaned up Pt and gave him some disposable pants. Pt was clam and helpful

## 2016-12-12 NOTE — Progress Notes (Signed)
Per MD notes, plan is for Patient to be emergently reevaluated by psychiatrist to determine IVC placement. [facility] if patient does meet IVC criteria. If patient does not meet these criteria, he will be discharged in the morning to his daughter's home. CSW has staffed with [facility] this morning who will follow up with psychiatry team on staff.    Enos Fling, MSW, LCSW [facility] ED/75M Clinical Social Worker [phone]

## 2016-12-12 NOTE — Care Management (Signed)
ED CM was informed despite arranging HH wrap around services and equipment patient was sent back to the ED by his daughter after he was taken to a SNF by [facility] today. ED CSW consulted as well, and has contacted the daughter concerning his return to [facility] ED. CSW will follow up for disposition plan.

## 2016-12-12 NOTE — ED Notes (Signed)
Pt. Got out of bed and was walking in hallway. RN assisted patient and attempted to get him into chair. Pt. Screaming and cursing at staff and daughter. Pt. Grabbing and Careers information officer. Pt. Assisted into chair by 2x RN. Pt. Wheeled back to room. Pt. Calm at this time.

## 2016-12-12 NOTE — ED Notes (Signed)
Yellow arm band and socks placed on pt.  

## 2016-12-13 LAB — URINE CULTURE: CULTURE: NO GROWTH

## 2016-12-13 MED ORDER — LORAZEPAM 1 MG PO TABS
1.0000 mg | ORAL_TABLET | Freq: Once | ORAL | Status: AC
  Administered 2016-12-13: 1 mg via ORAL
  Filled 2016-12-13: qty 1

## 2016-12-13 NOTE — ED Provider Notes (Signed)
Pt discharged home in care of daughter, who has constructed a safe place for him in her home.    Tilden Fossa, MD 12/13/16 2250

## 2016-12-13 NOTE — Progress Notes (Signed)
CSW has staffed with CSW AD. Patient is to be discharged into the care of his daughter first thing this morning- as daughter is arranging care. Patient's daughter continued to refuse bed offers and insurance will not pay for skilled nursing facility placement.    Enos Fling, MSW, LCSW [facility] ED/57M Clinical Social Worker [phone]

## 2016-12-13 NOTE — ED Notes (Signed)
Pt daughter called and said she is at the [place] working on placement for her dad. Daughter Tobi Bastos said she would have something by this afternoon.

## 2016-12-13 NOTE — ED Notes (Signed)
Pt cleaned of incontinence, medicated for agitation

## 2016-12-13 NOTE — ED Notes (Addendum)
Pt's daughter Tobi Bastos) called this RN to inform that she will be here by 5 to pick up the pt.

## 2016-12-13 NOTE — ED Notes (Signed)
Pt taken to daughters SUV. This RN assisted with loading pt into seat and secured with seatbelt. Daughter assured RN that help was waiting at home to unload pt. Pt's bottled Medications were given to daughter upon discharge.

## 2017-03-10 DEATH — deceased

## 2018-07-28 IMAGING — CR DG SHOULDER 2+V*R*
3 series · 3 of 3 positions shown · non-contrast
Comparison: None.

CLINICAL DATA: frequent falls and urinary frequency at home,
patient reports generalized right shoulder pain.

EXAM:
RIGHT SHOULDER - 2+ VIEW

[shoulder grashey]
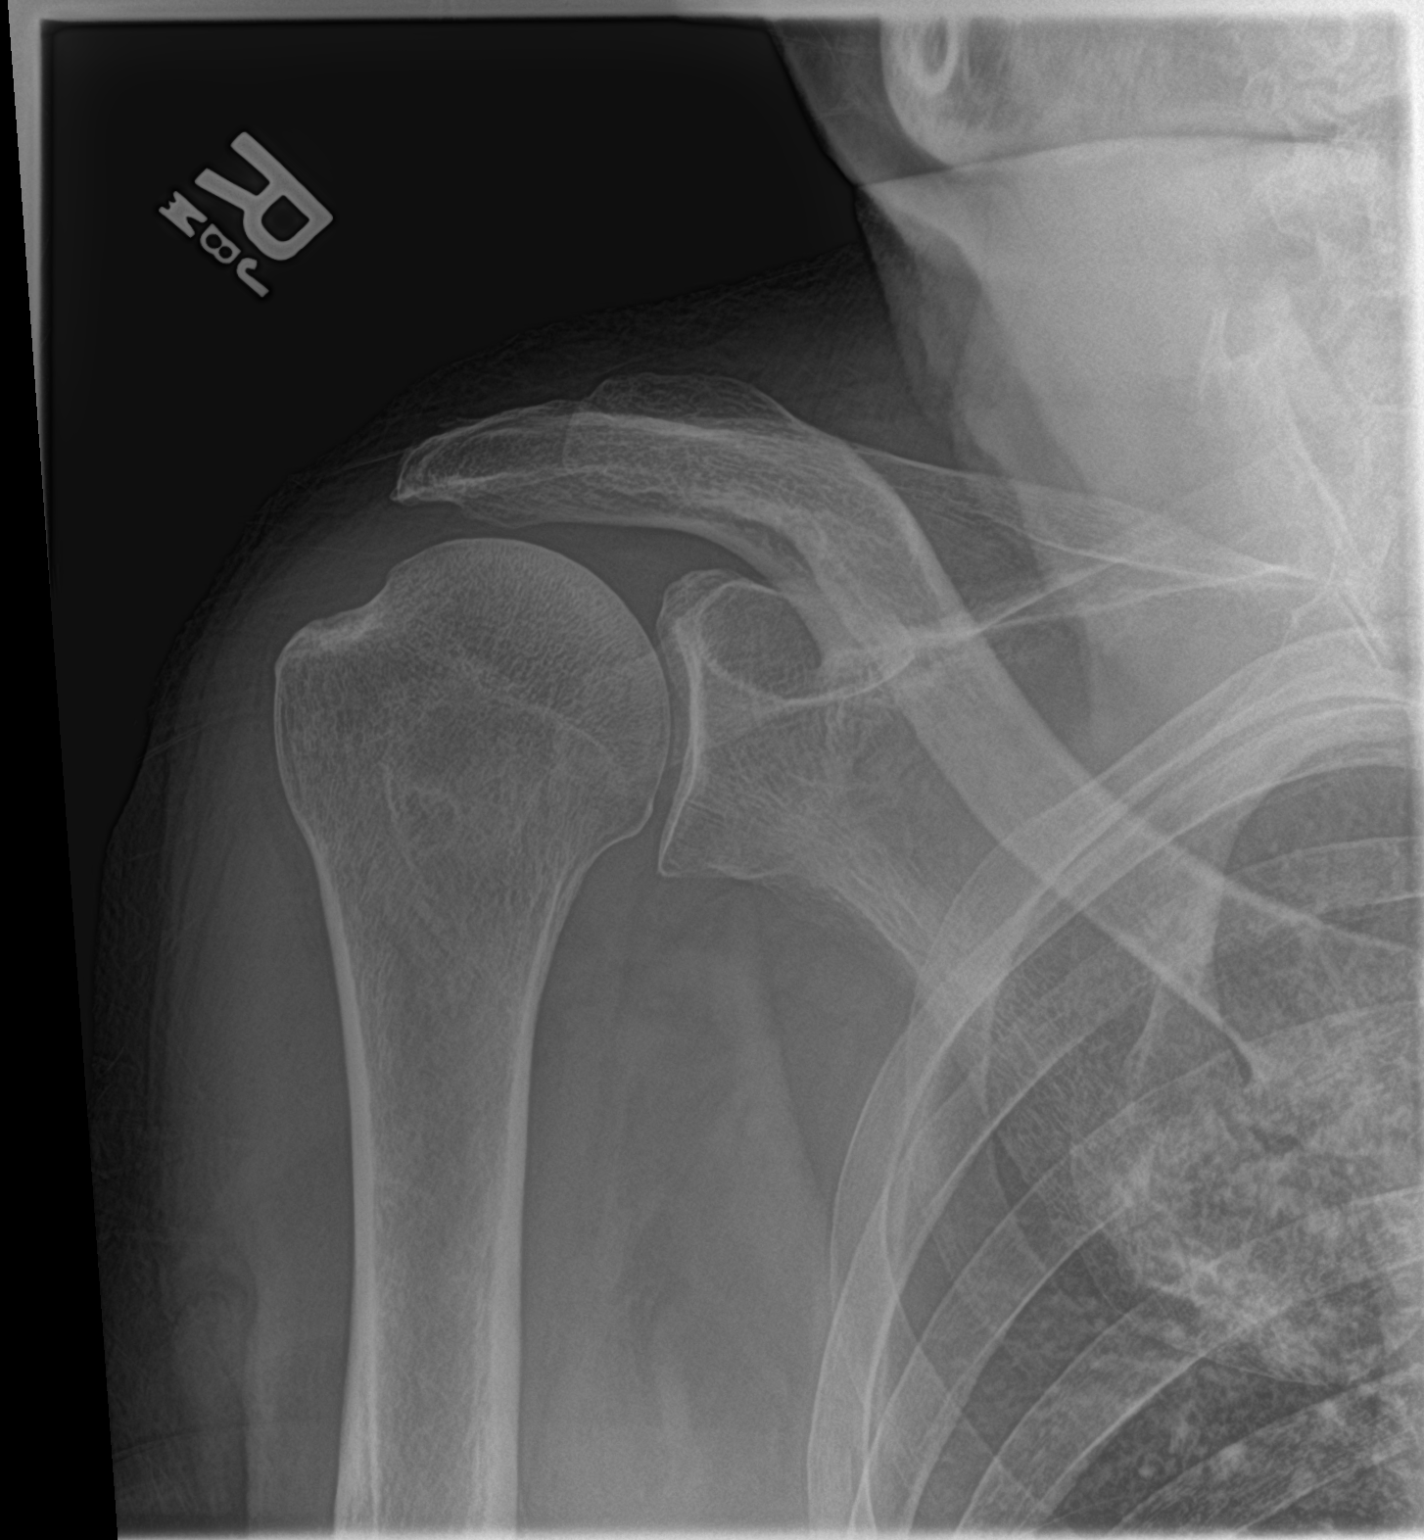

[shoulder y view]
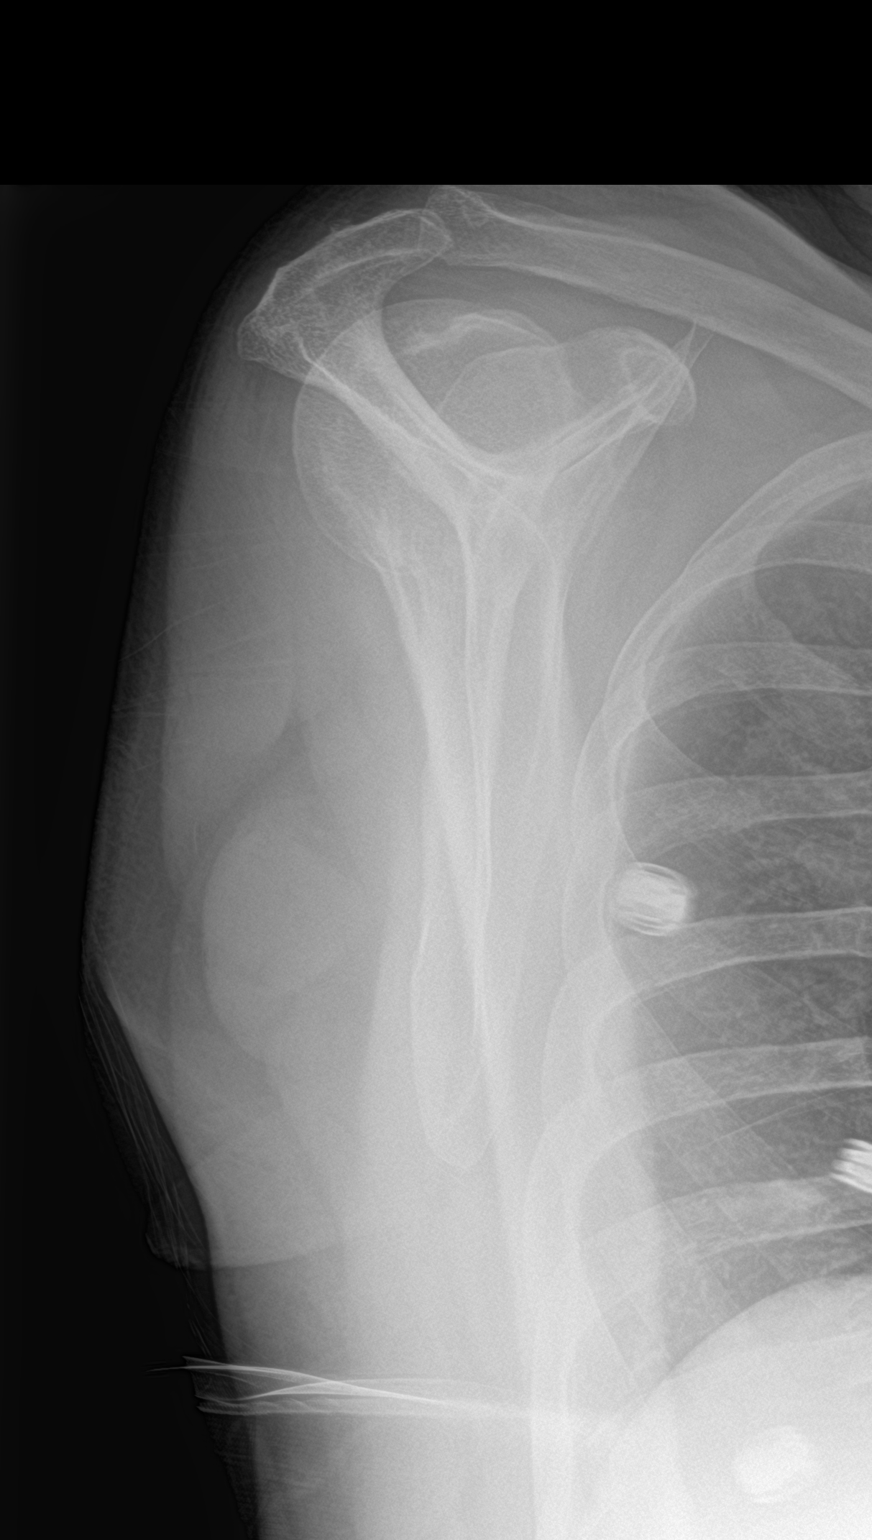

[shoulder axillary]
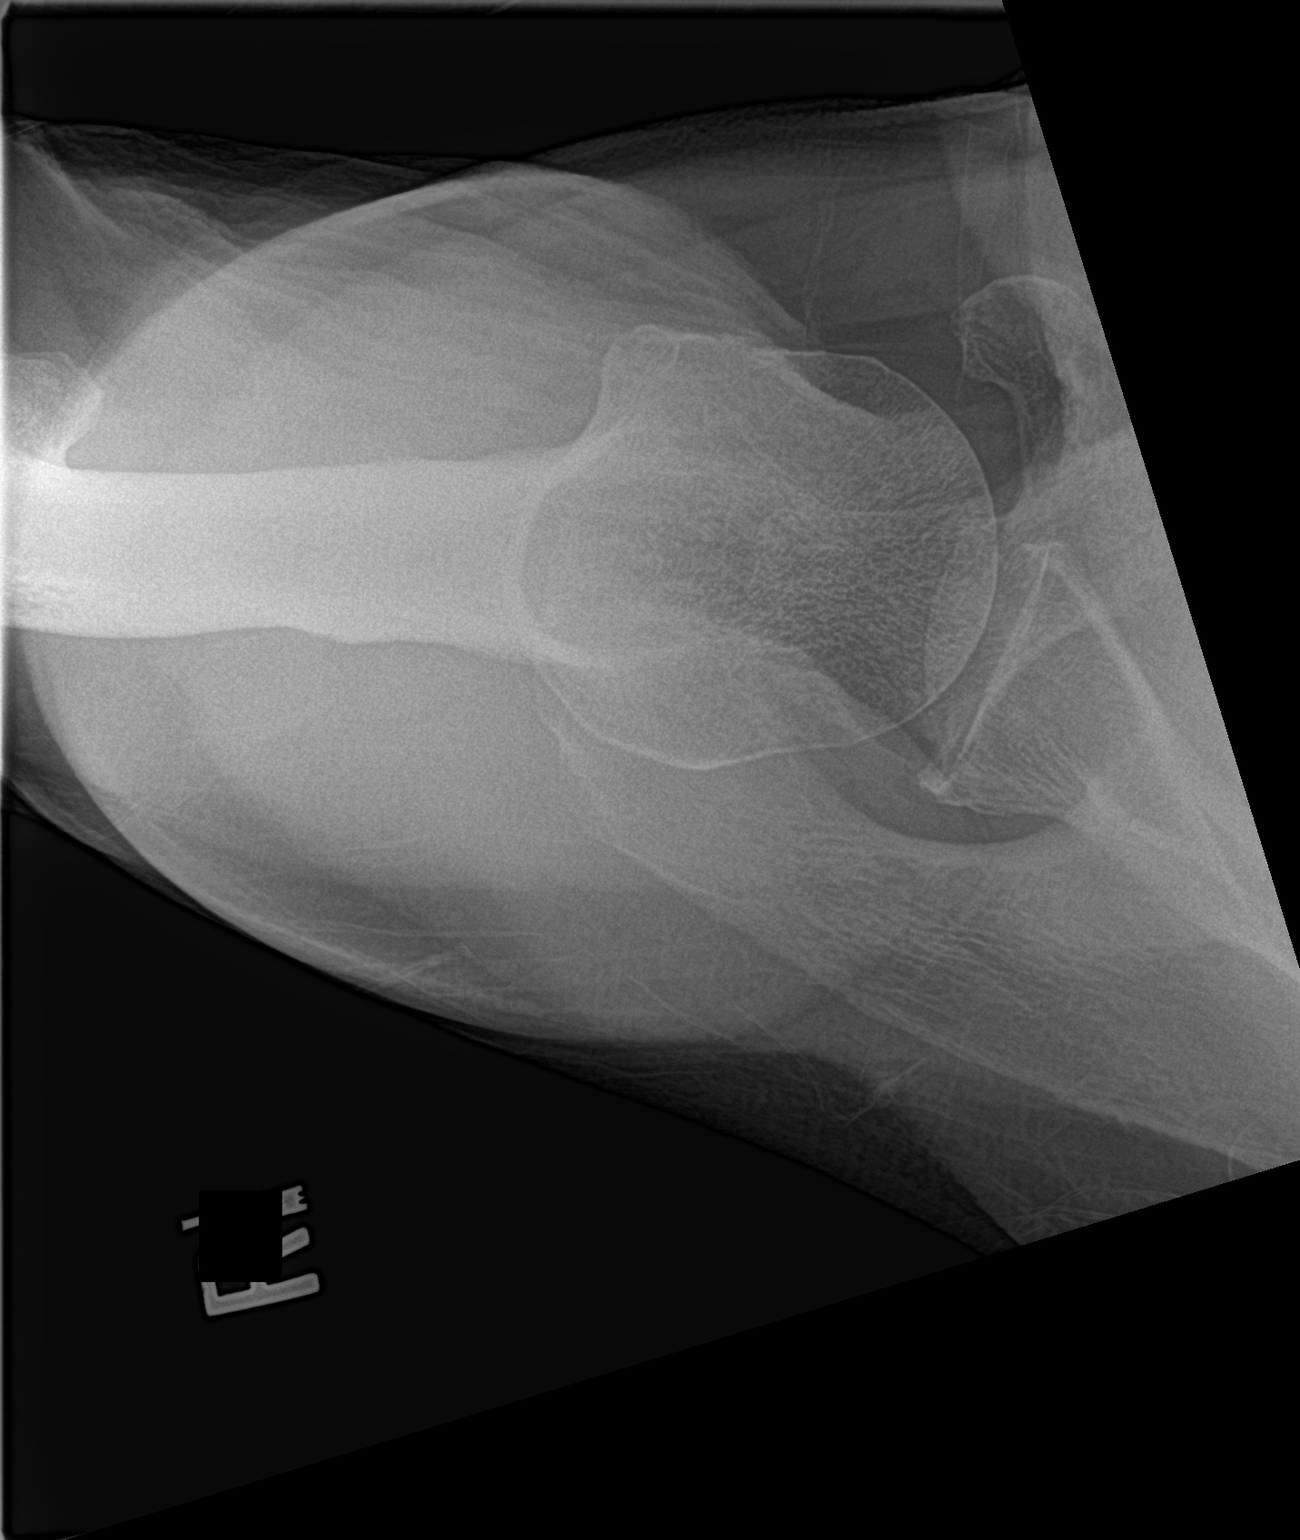

[3 of 3 positions shown; findings below may reference images not displayed]

FINDINGS: No fracture. No bone lesion. The glenohumeral and AC joints are
normally spaced and aligned with no significant arthropathic/
degenerative change.

Bones are demineralized.

Soft tissues are unremarkable.
IMPRESSION: No fracture or joint abnormality.
